# Patient Record
Sex: Male | Born: 1945 | Race: Black or African American | Hispanic: No | Marital: Married | Smoking: Current every day smoker
Health system: Southern US, Community
[De-identification: ages and names within clinical notes are randomized; demographics above are authoritative.]

## PROBLEM LIST (undated history)

## (undated) DIAGNOSIS — R569 Unspecified convulsions: Secondary | ICD-10-CM

## (undated) DIAGNOSIS — J449 Chronic obstructive pulmonary disease, unspecified: Secondary | ICD-10-CM

## (undated) DIAGNOSIS — I10 Essential (primary) hypertension: Secondary | ICD-10-CM

## (undated) HISTORY — PX: OTHER SURGICAL HISTORY: SHX169

---

## 2004-07-06 ENCOUNTER — Emergency Department: Payer: Self-pay | Admitting: Internal Medicine

## 2005-06-22 ENCOUNTER — Emergency Department: Payer: Self-pay | Admitting: Internal Medicine

## 2005-06-22 ENCOUNTER — Other Ambulatory Visit: Payer: Self-pay

## 2006-07-30 ENCOUNTER — Emergency Department: Payer: Self-pay | Admitting: Emergency Medicine

## 2006-11-01 ENCOUNTER — Emergency Department: Payer: Self-pay | Admitting: Emergency Medicine

## 2007-08-16 ENCOUNTER — Observation Stay: Payer: Self-pay | Admitting: Internal Medicine

## 2007-08-16 ENCOUNTER — Other Ambulatory Visit: Payer: Self-pay

## 2007-08-27 ENCOUNTER — Other Ambulatory Visit: Payer: Self-pay

## 2007-08-27 ENCOUNTER — Emergency Department: Payer: Self-pay | Admitting: Emergency Medicine

## 2007-10-08 ENCOUNTER — Emergency Department: Payer: Self-pay | Admitting: Emergency Medicine

## 2007-10-08 ENCOUNTER — Other Ambulatory Visit: Payer: Self-pay

## 2007-12-13 ENCOUNTER — Other Ambulatory Visit: Payer: Self-pay

## 2007-12-13 ENCOUNTER — Emergency Department: Payer: Self-pay | Admitting: Emergency Medicine

## 2008-05-07 ENCOUNTER — Ambulatory Visit: Payer: Self-pay

## 2008-06-09 ENCOUNTER — Emergency Department: Payer: Self-pay | Admitting: Internal Medicine

## 2008-07-07 ENCOUNTER — Inpatient Hospital Stay: Payer: Self-pay | Admitting: Specialist

## 2008-08-22 ENCOUNTER — Emergency Department: Payer: Self-pay | Admitting: Emergency Medicine

## 2008-11-10 ENCOUNTER — Inpatient Hospital Stay: Payer: Self-pay | Admitting: Internal Medicine

## 2009-06-10 ENCOUNTER — Emergency Department: Payer: Self-pay | Admitting: Emergency Medicine

## 2009-07-15 ENCOUNTER — Emergency Department: Payer: Self-pay | Admitting: Emergency Medicine

## 2009-08-11 ENCOUNTER — Emergency Department: Payer: Self-pay | Admitting: Unknown Physician Specialty

## 2009-08-31 ENCOUNTER — Emergency Department: Payer: Self-pay | Admitting: Emergency Medicine

## 2009-10-09 ENCOUNTER — Emergency Department: Payer: Self-pay | Admitting: Emergency Medicine

## 2009-10-10 ENCOUNTER — Inpatient Hospital Stay: Payer: Self-pay | Admitting: Internal Medicine

## 2009-12-14 ENCOUNTER — Inpatient Hospital Stay: Payer: Self-pay | Admitting: *Deleted

## 2010-02-10 ENCOUNTER — Ambulatory Visit: Payer: Self-pay | Admitting: Gastroenterology

## 2010-02-12 LAB — PATHOLOGY REPORT

## 2010-02-21 ENCOUNTER — Emergency Department: Payer: Self-pay | Admitting: Emergency Medicine

## 2010-05-03 ENCOUNTER — Inpatient Hospital Stay: Payer: Self-pay | Admitting: Internal Medicine

## 2010-11-15 ENCOUNTER — Inpatient Hospital Stay: Payer: Self-pay | Admitting: *Deleted

## 2011-01-05 ENCOUNTER — Ambulatory Visit: Payer: Self-pay | Admitting: Urology

## 2011-01-19 ENCOUNTER — Observation Stay: Payer: Self-pay | Admitting: Urology

## 2011-01-20 LAB — PATHOLOGY REPORT

## 2011-10-17 ENCOUNTER — Ambulatory Visit: Payer: Self-pay | Admitting: Internal Medicine

## 2012-04-30 ENCOUNTER — Ambulatory Visit: Payer: Self-pay | Admitting: Internal Medicine

## 2012-05-14 ENCOUNTER — Ambulatory Visit: Payer: Self-pay | Admitting: Internal Medicine

## 2012-10-19 ENCOUNTER — Ambulatory Visit: Payer: Self-pay | Admitting: Internal Medicine

## 2012-12-09 ENCOUNTER — Observation Stay: Payer: Self-pay | Admitting: Specialist

## 2012-12-09 LAB — CBC
HGB: 13 g/dL (ref 13.0–18.0)
MCHC: 34.3 g/dL (ref 32.0–36.0)
MCV: 87 fL (ref 80–100)
Platelet: 133 10*3/uL — ABNORMAL LOW (ref 150–440)
RBC: 4.34 10*6/uL — ABNORMAL LOW (ref 4.40–5.90)

## 2012-12-09 LAB — BASIC METABOLIC PANEL
Anion Gap: 8 (ref 7–16)
Calcium, Total: 8.8 mg/dL (ref 8.5–10.1)
Chloride: 91 mmol/L — ABNORMAL LOW (ref 98–107)
Creatinine: 1.01 mg/dL (ref 0.60–1.30)
EGFR (African American): >60
Glucose: 294 mg/dL — ABNORMAL HIGH (ref 65–99)
Osmolality: 267 (ref 275–301)
Potassium: 2.9 mmol/L — ABNORMAL LOW (ref 3.5–5.1)

## 2012-12-09 LAB — TROPONIN I
Troponin-I: <0.02 ng/mL
Troponin-I: <0.02 ng/mL

## 2012-12-09 LAB — CK TOTAL AND CKMB (NOT AT ARMC): CK, Total: 109 U/L (ref 35–232)

## 2012-12-09 LAB — HEMOGLOBIN A1C: Hemoglobin A1C: 3.7 % — ABNORMAL LOW (ref 4.2–6.3)

## 2012-12-10 DIAGNOSIS — R079 Chest pain, unspecified: Secondary | ICD-10-CM

## 2012-12-10 LAB — TROPONIN I: Troponin-I: <0.02 ng/mL

## 2012-12-10 LAB — BASIC METABOLIC PANEL
Anion Gap: 9 (ref 7–16)
BUN: 15 mg/dL (ref 7–18)
Creatinine: 0.91 mg/dL (ref 0.60–1.30)
EGFR (African American): >60
EGFR (Non-African Amer.): >60
Osmolality: 266 (ref 275–301)
Sodium: 132 mmol/L — ABNORMAL LOW (ref 136–145)

## 2012-12-10 LAB — SODIUM, URINE, RANDOM: Sodium, Urine Random: 152 mmol/L (ref 20–110)

## 2012-12-10 LAB — CK TOTAL AND CKMB (NOT AT ARMC): CK-MB: <0.5 ng/mL — ABNORMAL LOW (ref 0.5–3.6)

## 2012-12-10 LAB — CREATININE, URINE, RANDOM: Creatinine, Urine Random: 141.8 mg/dL — ABNORMAL HIGH (ref 30.0–125.0)

## 2013-04-08 ENCOUNTER — Ambulatory Visit: Payer: Self-pay | Admitting: Internal Medicine

## 2014-07-18 NOTE — H&P (Signed)
PATIENT NAME:  Larry Castaneda, Larry Castaneda MR#:  [id number] DATE OF BIRTH:  [birth year]  DATE OF ADMISSION:  12/09/2012  PRIMARY CARE PHYSICIAN:  [facility]   CHIEF COMPLAINT:  Chest pain.   HISTORY OF PRESENT ILLNESS: A 69 year old male patient with history of hypertension, likely undiagnosed diabetes mellitus, chronic pancreatitis and alcohol abuse, presents to the Emergency Room complaining of 2 weeks of chest pain. The patient was working in his yard 2 weeks back, noticed some central chest pain which was nonradiating with diaphoresis; no nausea or vomiting, shortness of breath, palpitations, or syncope. This lasted about half a day, resolved on its own without any medications. This reoccurred 2 days back with the patient again working in the yard. Lasted a similar time and resolved. The patient decided to present to the Emergency Room. Here his EKG has shown normal sinus rhythm with normal cardiac enzymes but, considering his risk factors of tobacco abuse, hypertension, diabetes, the patient is being admitted to the hospitalist service for a stress test and observation on tele and get cardiac enzymes. The patient does not have any diagnosis of GERD.    PAST MEDICAL HISTORY 1.  Hypertension.  2.  Chronic pancreatitis.  3.  Alcohol abuse.  4.  Tobacco abuse.  5.  Seizure disorder.   ALLERGIES: ACCUPRIL AND METOPROLOL.   SOCIAL HISTORY: The patient smokes a pack a day. He drinks a 6-pack of beer every day. No illicit drugs. He is retired.   CODE STATUS: Full code.   FAMILY HISTORY: No premature coronary artery disease in the family. Father had Alzheimer's disease.   HOME MEDICATIONS: Include 1.  Dilantin 100 mg daily.  2.  Norco 5/325, 1 tablet every 6 hours as needed.  3.  Norvasc 5 mg daily.   REVIEW OF SYSTEMS CONSTITUTIONAL: No fatigue, weakness, weight loss, weight gain.  EYES: No blurred vision, pain, redness.  ENT: No tinnitus, ear pain, hearing loss.  RESPIRATORY:  No  cough,weezing, hemoptysis.  CARDIOVASCULAR: No chest pain, orthopnea, edema.  GASTROINTESTINAL: No nausea, vomiting, diarrhea, abdominal pain.  GENITOURINARY: No dysuria, hematuria, frequency.  ENDOCRINE: No polyuria, nocturia, thyroid problems.  HEMATOLOGIC AND LYMPHATIC: No anemia, easy bruising, bleeding.  INTEGUMENTARY: No acne, rash, lesions.  MUSCULOSKELETAL: No back pain, arthritis.  NEUROLOGIC: No focal numbness, weakness, dysarthria.  PSYCHIATRIC: No anxiety or depression.   PHYSICAL EXAMINATION VITAL SIGNS: Temperature 98.3, pulse of 85, respirations 18, blood pressure 174/95, saturating 98% on room air.  GENERAL: Moderately-built African American male patient sitting at the side of the bed, comfortable, conversational, cooperative with exam.  PSYCHIATRIC:  Alert and oriented x 3. Mood and affect appropriate. Judgment intact.  HEENT: Atraumatic, normocephalic. Oral mucosa moist and pink. External ears and nose normal. No pallor. Pupils bilaterally equal and reactive to light. NECK: Supple. No thyromegaly. No palpable lymph nodes. Trachea midline. No carotid bruit or JVD.  CARDIOVASCULAR: S1, S2, without any murmurs. Peripheral pulses 2+. No edema, no chest tenderness.  RESPIRATORY: Normal work of breathing. Clear to auscultation on both sides.  GASTROINTESTINAL: Soft abdomen, nontender. Bowel sounds present. No hepatosplenomegaly palpable.  SKIN: Warm and dry. No petechiae, rash, ulcers.  MUSCULOSKELETAL: No joint swelling, redness, effusion of the large joints. Normal muscle tone.  NEUROLOGICAL: Motor strength 5 out of 5 in upper and lower extremities. Sensation to fine touch intact all over.  GENITOURINARY: No CVA tenderness or bladder distention.   LABORATORY AND RADIOLOGIC DATA:  Glucose 294, BUN 9, creatinine 1, sodium 128, potassium 2.9,  chloride 91. Troponin less than 0.02. WBC 9.9, hemoglobin 13, platelets of 133. Chest x-ray shows no acute disease. EKG shows normal sinus  rhythm, no acute changes.   ASSESSMENT AND PLAN 1.  Chest pain. Does not seem to have typical symptoms, did occur with exertion but does not happen every time. The patient does have chronic pancreatitis with smoking, alcohol; could be gastroesophageal reflux disease but considering the significant risk factors of hypertension, undiagnosed diabetes, tobacco abuse and uncontrolled hypertension we will admit the patient onto a telemetry floor. Check 2 more sets of cardiac enzymes and get a stress test tomorrow morning. If the stress test is negative, the patient should be discharged on proton pump inhibitors for possible gastroesophageal reflux disease.  We will also add aspirin.  2.  New-onset diabetes mellitus. The patient does have blood sugars of 296 with polyuria. Will check a HbA1c, start him on metformin.  3.  Uncontrolled diabetes mellitus. We will continue his home medications. Use IV p.r.n. medications. If sustained high, will need further addition of medications.  4.  Tobacco abuse. I have counseled the patient to quit smoking for greater than 3 minutes, explaining its effects on increasing risk factors along with hypertension and diabetes.  5.  Alcohol abuse. The patient is at high risk for alcohol withdraws. We will put him on a clinical institute withdrawal assessment of alcohol  protocol.  6.  Hyponatremia, a sodium of 128. He has had mild hyponatremia in the past. This seems worse, likely from the alcohol. We will check urine sodium, creatinine. We will repeat a BMP in the morning.  7.  Hypokalemia, likely secondary from alcohol. Replace. The patient is also on hydrochlorothiazide.  Will add potassium supplements daily.  8.  Mild thrombocytopenia, platelets of 133.  9.  Deep vein thrombosis prophylaxis with Lovenox.  10.  CODE STATUS: Full code.   TIME SPENT TODAY ON THIS CASE: Was 40 minutes.    ____________________________ Molinda BailiffSrikar R. Rudie Sermons, MD srs:cs D: 12/09/2012 14:09:47  ET T: 12/09/2012 14:39:49 ET JOB#: [id number]  cc: Wardell HeathSrikar R. Kepler Mccabe, MD, <Dictator> [name] Orie FishermanSRIKAR R Beyonce Sawatzky MD ELECTRONICALLY SIGNED 12/09/2012 17:18

## 2014-07-18 NOTE — Discharge Summary (Signed)
PATIENT NAME:  Larry Castaneda, Larry Castaneda MR#:  [id number] DATE OF BIRTH:  02/07/1946  For a detailed note, please see the history and physical done on admission by Dr. Elpidio AnisSudini.   DIAGNOSES AT DISCHARGE: As follows: Acute chest pain, likely suspected to be pericarditis, chronic obstructive pulmonary disease with ongoing tobacco abuse. Hypertension. Alcohol abuse. History of seizures.   DIET: The patient is being discharged on a low-sodium diet.   ACTIVITY: As tolerated.   FOLLOW-UP: [name] in the next 1 to 2 weeks.   DISCHARGE MEDICATIONS:  Hydrochlorothiazide 25 mg daily, albuterol inhaler 4 times daily as needed, amlodipine 5 mg daily  and ibuprofen 400 mg t.i.d. x3 days.   PERTINENT STUDIES DONE DURING THE HOSPITAL COURSE: Are as follows: A chest x-ray done on admission showing no acute cardiopulmonary disease. A nuclear medicine myocardial scan done showing no significant wall-motion abnormality. Overall a low-risk scan. EF of 60%. No EKG changes concerning for ischemia. Minor diffuse ST elevation and PR depression on EKG suggestive of pericarditis.   HOSPITAL COURSE: A [age] year old male with medical problems as mentioned above who  presented to the hospital with chest pain.   1.  Chest pain. The exact source of the patient's chest pain is unclear but suspected to be secondary to pericarditis. The patient's EKG during his stress test showed diffuse slight ST elevation with PR depression consistent with pericarditis although the patient denied any pleuritic type of his chest pain. His admission EKG did not show those changes, though. The patient was observed on telemetry, had 3 sets of cardiac markers checked which were negative, underwent a nuclear medicine myocardial scan which did not show any evidence of acute myocardial ischemia. The patient was therefore discharged home on some ibuprofen for the next 3 days to treat his underlying pericarditis.  2.  History of alcohol abuse. The  patient showed no evidence of alcohol withdrawal. He was maintained on CIWA protocol in the hospital.  3.  Hyponatremia and hypokalemia. This was likely secondary to poor p.o. intake and use of hydrochlorothiazide. These were supplemented with IV fluids and oral potassium supplementation, and they have now improved and resolved.  4.  History of seizures. These were likely alcohol withdrawal seizures, The patient has not had any seizures in the past 3 years, he is currently not taking any antiepileptics. He was therefore not discharged on any antiseizure medications at this time.  5.  Hypertension. The patient remained hemodynamically stable on his hydrochlorothiazide. He was also discharged on Norvasc, as he needed some additional meds for his blood pressure control.   CODE STATUS: FULL CODE.   TIME SPENT ON DISCHARGE: 40 minutes.    ____________________________ Rolly PancakeVivek J. Cherlynn KaiserSainani, MD vjs:np D: 12/10/2012 16:18:24 ET T: 12/10/2012 19:50:32 ET JOB#: [id number]  cc: Rolly PancakeVivek J. Cherlynn KaiserSainani, MD, <Dictator> [name] [place] SirenVIVEK J Secilia Apps MD ELECTRONICALLY SIGNED 12/13/2012 17:24

## 2014-10-16 ENCOUNTER — Other Ambulatory Visit: Payer: Self-pay | Admitting: Internal Medicine

## 2014-10-16 DIAGNOSIS — J439 Emphysema, unspecified: Secondary | ICD-10-CM

## 2014-10-27 ENCOUNTER — Ambulatory Visit: Admission: RE | Admit: 2014-10-27 | Payer: Medicare Other | Source: Ambulatory Visit

## 2014-10-30 ENCOUNTER — Inpatient Hospital Stay
Admission: EM | Admit: 2014-10-30 | Discharge: 2014-10-31 | DRG: 101 | Disposition: A | Discharge status: Home / Self Care | Payer: Medicare Other | Attending: Internal Medicine | Admitting: Internal Medicine

## 2014-10-30 ENCOUNTER — Emergency Department: Payer: Medicare Other

## 2014-10-30 ENCOUNTER — Encounter: Payer: Self-pay | Admitting: Emergency Medicine

## 2014-10-30 ENCOUNTER — Ambulatory Visit
Admission: RE | Admit: 2014-10-30 | Discharge: 2014-10-30 | Disposition: A | Discharge status: Home / Self Care | Payer: Medicare Other | Source: Ambulatory Visit | Attending: Internal Medicine | Admitting: Internal Medicine

## 2014-10-30 DIAGNOSIS — F10239 Alcohol dependence with withdrawal, unspecified: Secondary | ICD-10-CM | POA: Diagnosis present

## 2014-10-30 DIAGNOSIS — G40509 Epileptic seizures related to external causes, not intractable, without status epilepticus: Secondary | ICD-10-CM | POA: Diagnosis not present

## 2014-10-30 DIAGNOSIS — D696 Thrombocytopenia, unspecified: Secondary | ICD-10-CM | POA: Diagnosis present

## 2014-10-30 DIAGNOSIS — E871 Hypo-osmolality and hyponatremia: Secondary | ICD-10-CM | POA: Diagnosis present

## 2014-10-30 DIAGNOSIS — R569 Unspecified convulsions: Secondary | ICD-10-CM | POA: Diagnosis not present

## 2014-10-30 DIAGNOSIS — E785 Hyperlipidemia, unspecified: Secondary | ICD-10-CM | POA: Diagnosis present

## 2014-10-30 DIAGNOSIS — I10 Essential (primary) hypertension: Secondary | ICD-10-CM | POA: Diagnosis present

## 2014-10-30 DIAGNOSIS — E860 Dehydration: Secondary | ICD-10-CM | POA: Diagnosis present

## 2014-10-30 DIAGNOSIS — K219 Gastro-esophageal reflux disease without esophagitis: Secondary | ICD-10-CM | POA: Diagnosis present

## 2014-10-30 DIAGNOSIS — J449 Chronic obstructive pulmonary disease, unspecified: Secondary | ICD-10-CM | POA: Diagnosis present

## 2014-10-30 DIAGNOSIS — E876 Hypokalemia: Secondary | ICD-10-CM | POA: Diagnosis present

## 2014-10-30 DIAGNOSIS — Z820 Family history of epilepsy and other diseases of the nervous system: Secondary | ICD-10-CM | POA: Diagnosis not present

## 2014-10-30 DIAGNOSIS — J439 Emphysema, unspecified: Secondary | ICD-10-CM

## 2014-10-30 DIAGNOSIS — N400 Enlarged prostate without lower urinary tract symptoms: Secondary | ICD-10-CM | POA: Diagnosis present

## 2014-10-30 DIAGNOSIS — F10931 Alcohol use, unspecified with withdrawal delirium: Secondary | ICD-10-CM

## 2014-10-30 DIAGNOSIS — F17210 Nicotine dependence, cigarettes, uncomplicated: Secondary | ICD-10-CM | POA: Diagnosis present

## 2014-10-30 DIAGNOSIS — F10230 Alcohol dependence with withdrawal, uncomplicated: Secondary | ICD-10-CM | POA: Diagnosis not present

## 2014-10-30 DIAGNOSIS — F10231 Alcohol dependence with withdrawal delirium: Secondary | ICD-10-CM

## 2014-10-30 DIAGNOSIS — Z880 Allergy status to penicillin: Secondary | ICD-10-CM | POA: Diagnosis not present

## 2014-10-30 HISTORY — DX: Unspecified convulsions: R56.9

## 2014-10-30 HISTORY — DX: Chronic obstructive pulmonary disease, unspecified: J44.9

## 2014-10-30 HISTORY — DX: Essential (primary) hypertension: I10

## 2014-10-30 LAB — CBC WITH DIFFERENTIAL/PLATELET
Basophils Absolute: 0 10*3/uL (ref 0–0.1)
Eosinophils Absolute: 0 10*3/uL (ref 0–0.7)
HEMATOCRIT: 35.3 % — AB (ref 40.0–52.0)
Hemoglobin: 11.8 g/dL — ABNORMAL LOW (ref 13.0–18.0)
Lymphocytes Relative: 16 %
Lymphs Abs: 0.9 10*3/uL — ABNORMAL LOW (ref 1.0–3.6)
MCH: 30 pg (ref 26.0–34.0)
MCHC: 33.3 g/dL (ref 32.0–36.0)
MCV: 90.1 fL (ref 80.0–100.0)
MONO ABS: 0.9 10*3/uL (ref 0.2–1.0)
Monocytes Relative: 15 %
Neutro Abs: 4 10*3/uL (ref 1.4–6.5)
Neutrophils Relative %: 68 %
PLATELETS: 78 10*3/uL — AB (ref 150–440)
RBC: 3.92 MIL/uL — AB (ref 4.40–5.90)
RDW: 21.9 % — AB (ref 11.5–14.5)
WBC: 5.8 10*3/uL (ref 3.8–10.6)

## 2014-10-30 LAB — BASIC METABOLIC PANEL
Anion gap: 17 — ABNORMAL HIGH (ref 5–15)
BUN: 6 mg/dL (ref 6–20)
CHLORIDE: 91 mmol/L — AB (ref 101–111)
CO2: 21 mmol/L — ABNORMAL LOW (ref 22–32)
Calcium: 9.4 mg/dL (ref 8.9–10.3)
Creatinine, Ser: 0.76 mg/dL (ref 0.61–1.24)
GFR calc Af Amer: >60 mL/min (ref >60)
GFR calc non Af Amer: >60 mL/min (ref >60)
GLUCOSE: 135 mg/dL — AB (ref 65–99)
Potassium: 2.3 mmol/L — CL (ref 3.5–5.1)
Sodium: 129 mmol/L — ABNORMAL LOW (ref 135–145)

## 2014-10-30 LAB — URINE DRUG SCREEN, QUALITATIVE (ARMC ONLY)
AMPHETAMINES, UR SCREEN: NOT DETECTED
BENZODIAZEPINE, UR SCRN: NOT DETECTED
Barbiturates, Ur Screen: NOT DETECTED
Cannabinoid 50 Ng, Ur ~~LOC~~: NOT DETECTED
Cocaine Metabolite,Ur ~~LOC~~: NOT DETECTED
MDMA (Ecstasy)Ur Screen: NOT DETECTED
Methadone Scn, Ur: NOT DETECTED
Opiate, Ur Screen: NOT DETECTED
Phencyclidine (PCP) Ur S: NOT DETECTED
Tricyclic, Ur Screen: NOT DETECTED

## 2014-10-30 LAB — MAGNESIUM: MAGNESIUM: 1 mg/dL — AB (ref 1.7–2.4)

## 2014-10-30 LAB — ETHANOL: Alcohol, Ethyl (B): <5 mg/dL (ref <5)

## 2014-10-30 MED ORDER — ACETAMINOPHEN 650 MG RE SUPP: 650.0000 mg | Freq: Four times a day (QID) | RECTAL | Status: DC | PRN

## 2014-10-30 MED ORDER — ATORVASTATIN CALCIUM 20 MG PO TABS
20.0000 mg | ORAL_TABLET | Freq: Every evening | ORAL | Status: DC
  Administered 2014-10-30: 20 mg via ORAL
  Filled 2014-10-30: qty 1

## 2014-10-30 MED ORDER — PANTOPRAZOLE SODIUM 40 MG PO TBEC
40.0000 mg | DELAYED_RELEASE_TABLET | Freq: Every day | ORAL | Status: DC
  Administered 2014-10-30: 40 mg via ORAL
  Filled 2014-10-30 (×2): qty 1

## 2014-10-30 MED ORDER — LORAZEPAM 1 MG PO TABS: 1.0000 mg | ORAL_TABLET | Freq: Four times a day (QID) | ORAL | Status: DC | PRN

## 2014-10-30 MED ORDER — SODIUM CHLORIDE 0.9 % IV SOLN
1000.0000 mg | Freq: Once | INTRAVENOUS | Status: AC
  Administered 2014-10-30: 1000 mg via INTRAVENOUS
  Filled 2014-10-30 (×2): qty 10

## 2014-10-30 MED ORDER — SODIUM CHLORIDE 0.9 % IV BOLUS (SEPSIS)
1000.0000 mL | Freq: Once | INTRAVENOUS | Status: AC
  Administered 2014-10-30: 1000 mL via INTRAVENOUS

## 2014-10-30 MED ORDER — THIAMINE HCL 100 MG/ML IJ SOLN: 100.0000 mg | Freq: Every day | INTRAMUSCULAR | Status: DC

## 2014-10-30 MED ORDER — POTASSIUM CHLORIDE CRYS ER 20 MEQ PO TBCR
40.0000 meq | EXTENDED_RELEASE_TABLET | Freq: Once | ORAL | Status: AC
  Administered 2014-10-30: 40 meq via ORAL
  Filled 2014-10-30: qty 2

## 2014-10-30 MED ORDER — POTASSIUM CHLORIDE IN NACL 20-0.9 MEQ/L-% IV SOLN
INTRAVENOUS | Status: DC
  Administered 2014-10-30 – 2014-10-31 (×2): via INTRAVENOUS
  Filled 2014-10-30 (×6): qty 1000

## 2014-10-30 MED ORDER — SODIUM CHLORIDE 0.9 % IV SOLN
500.0000 mg | Freq: Two times a day (BID) | INTRAVENOUS | Status: DC
  Administered 2014-10-30 – 2014-10-31 (×2): 500 mg via INTRAVENOUS
  Filled 2014-10-30 (×4): qty 5

## 2014-10-30 MED ORDER — IOHEXOL 300 MG/ML  SOLN
75.0000 mL | Freq: Once | INTRAMUSCULAR | Status: AC | PRN
  Administered 2014-10-30: 75 mL via INTRAVENOUS

## 2014-10-30 MED ORDER — NICOTINE 14 MG/24HR TD PT24
14.0000 mg | MEDICATED_PATCH | Freq: Every day | TRANSDERMAL | Status: DC
  Administered 2014-10-30: 14 mg via TRANSDERMAL
  Filled 2014-10-30 (×2): qty 1

## 2014-10-30 MED ORDER — FOLIC ACID 1 MG PO TABS
1.0000 mg | ORAL_TABLET | Freq: Every day | ORAL | Status: DC
  Administered 2014-10-30 – 2014-10-31 (×2): 1 mg via ORAL
  Filled 2014-10-30 (×2): qty 1

## 2014-10-30 MED ORDER — LORAZEPAM 2 MG/ML IJ SOLN: 1.0000 mg | Freq: Four times a day (QID) | INTRAMUSCULAR | Status: DC | PRN

## 2014-10-30 MED ORDER — ACETAMINOPHEN 325 MG PO TABS: 650.0000 mg | ORAL_TABLET | Freq: Four times a day (QID) | ORAL | Status: DC | PRN

## 2014-10-30 MED ORDER — DOXAZOSIN MESYLATE 2 MG PO TABS
2.0000 mg | ORAL_TABLET | Freq: Every day | ORAL | Status: DC
  Administered 2014-10-30: 2 mg via ORAL
  Filled 2014-10-30: qty 1

## 2014-10-30 MED ORDER — LORAZEPAM 2 MG/ML IJ SOLN
1.0000 mg | Freq: Once | INTRAMUSCULAR | Status: AC
  Administered 2014-10-30: 1 mg via INTRAVENOUS
  Filled 2014-10-30: qty 1

## 2014-10-30 MED ORDER — ONDANSETRON HCL 4 MG PO TABS: 4.0000 mg | ORAL_TABLET | Freq: Four times a day (QID) | ORAL | Status: DC | PRN

## 2014-10-30 MED ORDER — ONDANSETRON HCL 4 MG/2ML IJ SOLN: 4.0000 mg | Freq: Four times a day (QID) | INTRAMUSCULAR | Status: DC | PRN

## 2014-10-30 MED ORDER — POTASSIUM CHLORIDE 10 MEQ/100ML IV SOLN
10.0000 meq | INTRAVENOUS | Status: AC
  Administered 2014-10-30 (×2): 10 meq via INTRAVENOUS
  Filled 2014-10-30 (×2): qty 100

## 2014-10-30 MED ORDER — POTASSIUM CHLORIDE CRYS ER 20 MEQ PO TBCR
20.0000 meq | EXTENDED_RELEASE_TABLET | Freq: Two times a day (BID) | ORAL | Status: DC
  Administered 2014-10-30 – 2014-10-31 (×2): 20 meq via ORAL
  Filled 2014-10-30 (×2): qty 1

## 2014-10-30 MED ORDER — ADULT MULTIVITAMIN W/MINERALS CH
1.0000 | ORAL_TABLET | Freq: Every day | ORAL | Status: DC
Start: 2014-10-30 — End: 2014-10-31
  Administered 2014-10-30: 1 via ORAL
  Filled 2014-10-30: qty 1

## 2014-10-30 MED ORDER — SODIUM CHLORIDE 0.9 % IJ SOLN
3.0000 mL | Freq: Two times a day (BID) | INTRAMUSCULAR | Status: DC
  Administered 2014-10-30: 3 mL via INTRAVENOUS

## 2014-10-30 MED ORDER — VITAMIN B-1 100 MG PO TABS
100.0000 mg | ORAL_TABLET | Freq: Every day | ORAL | Status: DC
  Administered 2014-10-30 – 2014-10-31 (×2): 100 mg via ORAL
  Filled 2014-10-30 (×2): qty 1

## 2014-10-30 NOTE — ED Notes (Signed)
Pharmacy called to sent Keppra to the floor now instead.

## 2014-10-30 NOTE — ED Provider Notes (Signed)
[facility] Emergency Department Provider Note   ____________________________________________  Time seen: 3:35 PM I have reviewed the triage vital signs and the triage nursing note.  HISTORY  Chief Complaint Seizures   Historian Patient, limited historian. EMS provides additional history.  HPI Larry Castaneda is a 69 y.o. male who is brought in by EMS after having 2 seizures. EMS did not notice post ictal state. Although he has a poor historian. He was seen at [facility] clinic today for a chest CT. Patient had no reported traumatic history. Patient states he had a seizure about 2 years ago, however he does not know if he is on seizure medications, and he denies alcohol withdrawal in the past. He is giving a history of drinking alcohol but can't tell if it is daily or chronic.  No recent illnesses. Being worked up with pulmonology for emphysema. He had a chest CT today.  After while patient was able to provide additional history that he does drink daily, he had been drinking "gin "undetermined amount up until about 3 days ago when he stopped drinking gin and started drinking just 2 beers daily. First he told me he might of had a head injury or fall striking head, and then he says he thinks he did not.   Past Medical History  Diagnosis Date  . Hypertension   . Seizures   . COPD (chronic obstructive pulmonary disease)     There are no active problems to display for this patient.   History reviewed. No pertinent past surgical history.  Current Outpatient Rx  Name  Route  Sig  Dispense  Refill  . atorvastatin (LIPITOR) 20 MG tablet   Oral   Take 20 mg by mouth every evening.         Marland Kitchen doxazosin (CARDURA) 2 MG tablet   Oral   Take 2 mg by mouth at bedtime.         . hydrochlorothiazide (HYDRODIURIL) 25 MG tablet   Oral   Take 25 mg by mouth daily.         Marland Kitchen omeprazole (PRILOSEC) 40 MG capsule   Oral   Take 40 mg by mouth daily.            Allergies Penicillins  Family History  Problem Relation Age of Onset  . Family history unknown: Yes    Social History History  Substance Use Topics  . Smoking status: Current Every Day Smoker -- 1.00 packs/day    Types: Cigarettes  . Smokeless tobacco: Never Used  . Alcohol Use: 1.2 oz/week    2 Cans of beer per week     Comment: was drinking Gin up until 3 days ago drank 1 beer 8/3   positive for alcohol use, patient unable to quantify. Positive smoker.  Review of Systems Limited as patient is a poor historian/possibly post ictal Constitutional: . Eyes: Negative for visual changes. ENT: Negative for sore throat. Cardiovascular: Negative for chest pain. Respiratory: Negative for shortness of breath. Gastrointestinal: Negative for abdominal pain, vomiting and diarrhea. Genitourinary: Negative for dysuria. Musculoskeletal: Negative for back pain. Skin: Negative for rash. Neurological: Negative for headaches, focal weakness or numbness. 10 point Review of Systems otherwise negative ____________________________________________   PHYSICAL EXAM:  VITAL SIGNS: ED Triage Vitals  Enc Vitals Group     BP --      Pulse --      Resp --      Temp --      Temp  src --      SpO2 10/30/14 1538 97 %     Weight --      Height --      Head Cir --      Peak Flow --      Pain Score --      Pain Loc --      Pain Edu? --      Excl. in GC? --      Constitutional: Alert and confused. In no acute distress. Eyes: Right eye conjunctiva. PERRL. Normal extraocular movements. ENT   Head: Normocephalic and atraumatic.   Nose: No congestion/rhinnorhea.   Mouth/Throat: Mucous membranes are moist.   Neck: No stridor. Cardiovascular/Chest: Normal rate, regular rhythm.  No murmurs, rubs, or gallops. Respiratory: Normal respiratory effort without tachypnea nor retractions. Breath sounds are clear and equal bilaterally. No wheezes/rales/rhonchi. Gastrointestinal: Soft. No  distention, no guarding, no rebound. Nontender   Genitourinary/rectal:Deferred Musculoskeletal: Nontender with normal range of motion in all extremities. No joint effusions.  No lower extremity tenderness nor edema. Neurologic:  No slurred speech. Poor historian, trouble memory. No gross or focal neurologic deficits are appreciated. Skin:  Skin is warm, dry and intact. No rash noted.   ____________________________________________   EKG I, Governor Rooks, MD, the attending physician have personally viewed and interpreted all ECGs.  98 beats appear normal sinus rhythm. Narrow QRS. Normal axis. Normal ST and T-wave. ____________________________________________  LABS (pertinent positives/negatives)  Sodium 129 Potassium 2.3 Chloride 91 CO2 21 Ethanol less than 5 White blood count 5.8, hemoglobin 1.8, platelet count 78  ____________________________________________  RADIOLOGY All Xrays were viewed by me. Imaging interpreted by Radiologist.  CT head noncontrast:  IMPRESSION: 1. No acute intracranial abnormality or significant interval change. 2. Stable atrophy and white matter disease.  __________________________________________  PROCEDURES  Procedure(s) performed: None Critical Care performed: None  ____________________________________________   ED COURSE / ASSESSMENT AND PLAN  CONSULTATIONS: Hospitalist for admission  Pertinent labs & imaging results that were available during my care of the patient were reviewed by me and considered in my medical decision making (see chart for details).  Patient is poor historian versus postictal, who after additional history seems like source of seizure is most likely alcohol withdrawal. Given the confusing history, as well as his risk factors of being alcoholic, I am going to check a head CT to rule out intracranial abnormality as a source of seizure. Patient was given 1 mg Ativan IV due to the clinical history.  When daughter  arrived, she gave a history that the patient was previously on Dilantin and she was not aware that he had been taken off of it. The patient seemed a little drowsy and still out of it, however he did type up at that point in time and stated that he was told he was allergic to it. I am going to load him with a dose of Keppra.  Multiple electrolyte abnormalities including low sodium. Last labs in our system from 2014 which also showed low sodium. I suspect this is chronic. His potassium was significantly low, and he is given by mouth and IV replacement.  Heart rate at rest was 110 when I walked in the room and he was resting, it did trend back down into the 90s. His blood pressures systolic in the 130s to 140s. I'm still suspicious of alcohol withdrawal/delirium tremens. The patient does not want to do alcohol rehabilitation, however he is willing to stay in the hospital for observation monitoring and  treatment due to alcohol withdrawal seizures.  Patient / Family / Caregiver informed of clinical course, medical decision-making process, and agree with plan.   ___________________________________________   FINAL CLINICAL IMPRESSION(S) / ED DIAGNOSES   Final diagnoses:  Seizure  Delirium tremens  Hypokalemia       Governor Rooks, MD 10/30/14 1825

## 2014-10-30 NOTE — ED Notes (Signed)
Dr. Shaune Pollack made aware of potassium level.

## 2014-10-30 NOTE — H&P (Signed)
[facility] - [place] at [facility]   PATIENT NAME: Larry Castaneda    MR#:  [id number]  DATE OF BIRTH:  1945/06/16  DATE OF ADMISSION:  10/30/2014  PRIMARY CARE PHYSICIAN: Hyman Hopes, MD   REQUESTING/REFERRING PHYSICIAN: Dr. Governor Rooks  CHIEF COMPLAINT:   Chief Complaint  Patient presents with  . Seizures   Recurrent seizures  HISTORY OF PRESENT ILLNESS:  Larry Castaneda  is a 69 y.o. male with a known history of hypertension, seizures, COPD with ongoing tobacco abuse, GERD who presented to the hospital after a witnessed seizure by his daughter. Patient apparently went for a pulmonary function tests and CT chest earlier today to workup his COPD and after he got home from the tests when he was getting out of his car he fell backwards and hit his head and then the daughter noticed that he was shaking vigorously. She also noticed that he had some whitish foam coming out of the side of his mouth. Patient was having a tonic-clonic seizure with loss of anywhere from 5-7 minutes as per his daughter. Patient cannot recall these events at all. He was somewhat lethargic after the event and then brought to the ER for further evaluation. Patient was noted to be hyponatremic and hypokalemic and likely thought to have an alcohol withdrawal seizure. Hospitalist services were contacted for further treatment and evaluation.  PAST MEDICAL HISTORY:   Past Medical History  Diagnosis Date  . Hypertension   . Seizures   . COPD (chronic obstructive pulmonary disease)     PAST SURGICAL HISTORY:  History reviewed. No pertinent past surgical history.  SOCIAL HISTORY:   History  Substance Use Topics  . Smoking status: Current Every Day Smoker -- 1.00 packs/day    Types: Cigarettes  . Smokeless tobacco: Never Used  . Alcohol Use: 1.2 oz/week    2 Cans of beer per week     Comment: was drinking Gin up until 3 days ago drank 1 beer 8/3    FAMILY HISTORY:    Family History  Problem Relation Age of Onset  . Alzheimer's disease Father     DRUG ALLERGIES:   Allergies  Allergen Reactions  . Penicillins Swelling    REVIEW OF SYSTEMS:   Review of Systems  Constitutional: Negative for fever and weight loss.  HENT: Negative for congestion, nosebleeds and tinnitus.   Eyes: Negative for blurred vision, double vision and redness.  Respiratory: Negative for cough, hemoptysis and shortness of breath.   Cardiovascular: Negative for chest pain, orthopnea, leg swelling and PND.  Gastrointestinal: Negative for nausea, vomiting, abdominal pain, diarrhea and melena.  Genitourinary: Negative for dysuria, urgency and hematuria.  Musculoskeletal: Negative for joint pain and falls.  Neurological: Positive for seizures (likely alcohol withdrawal). Negative for dizziness, tingling, sensory change, focal weakness, weakness and headaches.  Endo/Heme/Allergies: Negative for polydipsia. Does not bruise/bleed easily.  Psychiatric/Behavioral: Negative for depression and memory loss. The patient is not nervous/anxious.     MEDICATIONS AT HOME:   Prior to Admission medications   Medication Sig Start Date End Date Taking? Authorizing Provider  atorvastatin (LIPITOR) 20 MG tablet Take 20 mg by mouth every evening.   Yes Historical Provider, MD  doxazosin (CARDURA) 2 MG tablet Take 2 mg by mouth at bedtime.   Yes Historical Provider, MD  hydrochlorothiazide (HYDRODIURIL) 25 MG tablet Take 25 mg by mouth daily.   Yes Historical Provider, MD  omeprazole (PRILOSEC) 40 MG capsule Take 40 mg by mouth  daily.   Yes Historical Provider, MD      VITAL SIGNS:  Blood pressure 139/87, pulse 105, temperature 98.3 F (36.8 C), temperature source Oral, resp. rate 17, height  (1.854 m), weight 72.576 kg (160 lb), SpO2 94 %.  PHYSICAL EXAMINATION:  Physical Exam  GENERAL:  69 y.o.-year-old patient lying in the bed with no acute distress.  EYES: Pupils equal, round,  reactive to light and accommodation. No scleral icterus. Extraocular muscles intact.  HEENT: Head atraumatic, normocephalic. Oropharynx and nasopharynx clear. No oropharyngeal erythema, moist oral mucosa  NECK:  Supple, no jugular venous distention. No thyroid enlargement, no tenderness.  LUNGS: Normal breath sounds bilaterally, no wheezing, rales, rhonchi. No use of accessory muscles of respiration.  CARDIOVASCULAR: S1, S2 RRR. No murmurs, rubs, gallops, clicks.  ABDOMEN: Soft, nontender, nondistended. Bowel sounds present. No organomegaly or mass.  EXTREMITIES: No pedal edema, cyanosis, or clubbing. + 2 pedal & radial pulses b/l.   NEUROLOGIC: Cranial nerves II through XII are intact. No focal Motor or sensory deficits appreciated b/l PSYCHIATRIC: The patient is alert and oriented x 3.  SKIN: No obvious rash, lesion, or ulcer.   LABORATORY PANEL:   CBC  Recent Labs Lab 10/30/14 1551  WBC 5.8  HGB 11.8*  HCT 35.3*  PLT 78*   ------------------------------------------------------------------------------------------------------------------  Chemistries   Recent Labs Lab 10/30/14 1551  NA 129*  K 2.3*  CL 91*  CO2 21*  GLUCOSE 135*  BUN 6  CREATININE 0.76  CALCIUM 9.4   ------------------------------------------------------------------------------------------------------------------  Cardiac Enzymes No results for input(s): TROPONINI in the last 168 hours. ------------------------------------------------------------------------------------------------------------------  RADIOLOGY:  Ct Head Wo Contrast  10/30/2014   CLINICAL DATA:  Seizures.  EXAM: CT HEAD WITHOUT CONTRAST  TECHNIQUE: Contiguous axial images were obtained from the base of the skull through the vertex without intravenous contrast.  COMPARISON:  MRI brain 04/08/2013.  FINDINGS: Mild periventricular white matter hypoattenuation is similar to the prior studies. No acute infarct, hemorrhage, or mass lesion is  present. Mild generalized atrophy is within normal limits for age. The ventricles are of normal size. No significant extra-axial fluid collection is present.  A single left ethmoid air cell is opacified. The paranasal sinuses and mastoid air cells are otherwise clear.  IMPRESSION: 1. No acute intracranial abnormality or significant interval change. 2. Stable atrophy and white matter disease.   Electronically Signed   By: Marin Roberts M.D.   On: 10/30/2014 16:45   Ct Chest W Contrast  10/30/2014   CLINICAL DATA:  69 year old male with history of cough for the past 4 months.  EXAM: CT CHEST WITH CONTRAST  TECHNIQUE: Multidetector CT imaging of the chest was performed during intravenous contrast administration.  CONTRAST:  75mL OMNIPAQUE IOHEXOL 300 MG/ML  SOLN  COMPARISON:  Chest CT 10/19/2012.  FINDINGS: Mediastinum/Lymph Nodes: Heart size is normal. There is no significant pericardial fluid, thickening or pericardial calcification. There is atherosclerosis of the thoracic aorta, the great vessels of the mediastinum and the coronary arteries, including calcified atherosclerotic plaque in the left anterior descending and right coronary arteries. Calcifications of the aortic valve. No pathologically enlarged mediastinal or hilar lymph nodes. Esophagus is unremarkable in appearance. No axillary lymphadenopathy.  Lungs/Pleura: Mild diffuse bronchial wall thickening. Moderate centrilobular and paraseptal emphysema. No acute consolidative airspace disease. No pleural effusions. No suspicious appearing pulmonary nodules or masses. Areas of architectural distortion and noted dependently in the lower lobes of the lungs bilaterally, similar to the prior study, compatible with areas of  mild chronic scarring.  Upper Abdomen: Extensive calcifications throughout the visualized portions of the pancreas, presumably indicative of chronic pancreatitis. Diffuse low attenuation throughout the hepatic parenchyma, indicative of  a background of hepatic steatosis. At the border of segments 7 and 8 in the liver (image 65 of series 2) there is a 2.7 x 2.2 cm lesion which appears accentuated by surrounding fatty liver, with an overall Hounsfield units of approximately 40 within the lesion. This is similar in size in retrospect to prior study 10/19/2012, at which point this also measured approximately 42 HU. More remote prior examination from 10/10/2009 demonstrates a cavernous hemangioma. Atherosclerosis.  Musculoskeletal/Soft Tissues: There are no aggressive appearing lytic or blastic lesions noted in the visualized portions of the skeleton.  IMPRESSION: 1. No acute findings in the thorax. 2. Diffuse bronchial wall thickening with moderate centrilobular and paraseptal emphysema; imaging findings suggestive of underlying COPD. 3. Atherosclerosis, including 2 vessel coronary artery disease. Please note that although the presence of coronary artery calcium documents the presence of coronary artery disease, the severity of this disease and any potential stenosis cannot be assessed on this non-gated CT examination. Assessment for potential risk factor modification, dietary therapy or pharmacologic therapy may be warranted, if clinically indicated. 4. Hepatic steatosis. 5. The appearance of the pancreas suggests chronic pancreatitis. 6. 2.7 x 2.2 cm intermediate attenuation lesion in the right lobe of the liver, as above, very similar compared to prior CT scans dating back to 10/10/2009, at which point this lesion was slightly smaller, but had imaging characteristics compatible with a hemangioma.   Electronically Signed   By: Trudie Reed M.D.   On: 10/30/2014 16:59     IMPRESSION AND PLAN:   69 year old male with past medical history of COPD with ongoing tobacco abuse, hypertension, GERD, alcohol abuse, history of previous seizures who presented to the hospital due to a witnessed seizure.  #1 seizures-patient apparently had a  tonic-clonic seizure which was witnessed by the patient's daughter. -I suspect this is likely an alcohol withdrawal seizure patient's last drink was yesterday morning. -Patient had a CT head and cervical spine which were negative. Patient has been loaded with IV Keppra. I will start the patient on schedule Keppra. Seizure precautions. -Neurology consult, an EEG in the morning.  #2 hyponatremia-this is likely hypotonic hypovolemic hyponatremia. -We'll hydrate the patient with IV fluids and follow sodium. This is likely secondary to patient's alcohol abuse.  #3 hypokalemia-also secondary to his ongoing alcohol abuse and dehydration. -We'll replace potassium accordingly and repeat level. Check magnesium level.  #4 alcohol abuse-patient is at high risk for alcohol withdrawal. -We'll place patient on CIWA protocol.  #5 thrombocytopenia-this is likely secondary to patient's alcohol abuse. -I will check a limited abdominal ultrasound. Will follow platelet count.  #6 BPH-continue doxazosin.  #7 hyperlipidemia-continue atorvastatin.   All the records are reviewed and case discussed with ED provider. Management plans discussed with the patient, family and they are in agreement.  CODE STATUS: Full  TOTAL TIME TAKING CARE OF THIS PATIENT: 50 minutes.    Houston Siren M.D on 10/30/2014 at 7:09 PM  Between 7am to 6pm - Pager - [phone]  After 6pm go to [email or web address] - password [email or web address]  [facility]  Office  [phone]  CC: Primary care physician; Hyman Hopes, MD

## 2014-10-30 NOTE — ED Notes (Addendum)
Pt arrived via EMS from home. Per EMS daughter witnessed patient have 2 seizures.  When EMS arrived patient was not post ictal. Upon arrival to ED patient was alert and able to answer some questions. Uncertain to date, place and situation.  Pt states it has been 2-3 years since last seizure.  Had CT scan at [facility] but is not sure why.

## 2014-10-30 NOTE — Consult Note (Addendum)
CC: seizures  HPI: Larry Castaneda is an 69 y.o. male  with a known history of hypertension, seizures, COPD with ongoing tobacco abuse, GERD who presented to the hospital after a witnessed seizure by his daughter.  Patient was having a tonic-clonic seizure with loss of anywhere from 5-7 minutes as per his daughter. Patient cannot recall these events at all. Post ictal post seizure. Pt has ETOH use and last drink day prior to admission.    Past Medical History  Diagnosis Date  . Hypertension   . Seizures   . COPD (chronic obstructive pulmonary disease)     History reviewed. No pertinent past surgical history.  Family History  Problem Relation Age of Onset  . Alzheimer's disease Father     Social History:  reports that he has been smoking Cigarettes.  He has been smoking about 1.00 pack per day. He has never used smokeless tobacco. He reports that he drinks about 1.2 oz of alcohol per week. He reports that he does not use illicit drugs.  Allergies  Allergen Reactions  . Penicillins Swelling    Medications: I have reviewed the patient's current medications.  ROS: History obtained from the patient  General ROS: negative for - chills, fatigue, fever, night sweats, weight gain or weight loss Psychological ROS: negative for - behavioral disorder, hallucinations, memory difficulties, mood swings or suicidal ideation Ophthalmic ROS: negative for - blurry vision, double vision, eye pain or loss of vision ENT ROS: negative for - epistaxis, nasal discharge, oral lesions, sore throat, tinnitus or vertigo Allergy and Immunology ROS: negative for - hives or itchy/watery eyes Hematological and Lymphatic ROS: negative for - bleeding problems, bruising or swollen lymph nodes Endocrine ROS: negative for - galactorrhea, hair pattern changes, polydipsia/polyuria or temperature intolerance Respiratory ROS: negative for - cough, hemoptysis, shortness of breath or wheezing Cardiovascular ROS:  negative for - chest pain, dyspnea on exertion, edema or irregular heartbeat Gastrointestinal ROS: negative for - abdominal pain, diarrhea, hematemesis, nausea/vomiting or stool incontinence Genito-Urinary ROS: negative for - dysuria, hematuria, incontinence or urinary frequency/urgency Musculoskeletal ROS: negative for - joint swelling or muscular weakness Neurological ROS: as noted in HPI Dermatological ROS: negative for rash and skin lesion changes  Physical Examination: Blood pressure 151/81, pulse 67, temperature 97.8 F (36.6 C), temperature source Oral, resp. rate 18, height  (1.854 m), weight 63.095 kg (139 lb 1.6 oz), SpO2 98 %.  Neurological Examination Mental Status: Alert, oriented, thought content appropriate.  Speech fluent without evidence of aphasia.  Able to follow 3 step commands without difficulty. Cranial Nerves: II: Discs flat bilaterally; Visual fields grossly normal, pupils equal, round, reactive to light and accommodation III,IV, VI: ptosis not present, extra-ocular motions intact bilaterally V,VII: smile symmetric, facial light touch sensation normal bilaterally VIII: hearing normal bilaterally IX,X: gag reflex present XI: bilateral shoulder shrug XII: midline tongue extension Motor: Right : Upper extremity   5/5    Left:     Upper extremity   5/5  Lower extremity   5/5     Lower extremity   5/5 Tone and bulk:normal tone throughout; no atrophy noted Sensory: Pinprick and light touch intact throughout, bilaterally Deep Tendon Reflexes: 2+ and symmetric throughout Plantars: Right: downgoing   Left: downgoing Cerebellar: normal finger-to-nose, normal rapid alternating movements and normal heel-to-shin test Gait: normal gait and station      Laboratory Studies:   Basic Metabolic Panel:  Recent Labs Lab 10/30/14 1551  NA 129*  K 2.3*  CL 91*  CO2 21*  GLUCOSE 135*  BUN 6  CREATININE 0.76  CALCIUM 9.4  MG 1.0*    Liver Function Tests: No  results for input(s): AST, ALT, ALKPHOS, BILITOT, PROT, ALBUMIN in the last 168 hours. No results for input(s): LIPASE, AMYLASE in the last 168 hours. No results for input(s): AMMONIA in the last 168 hours.  CBC:  Recent Labs Lab 10/30/14 1551  WBC 5.8  NEUTROABS 4.0  HGB 11.8*  HCT 35.3*  MCV 90.1  PLT 78*    Cardiac Enzymes: No results for input(s): CKTOTAL, CKMB, CKMBINDEX, TROPONINI in the last 168 hours.  BNP: Invalid input(s): POCBNP  CBG: No results for input(s): GLUCAP in the last 168 hours.  Microbiology: No results found for this or any previous visit.  Coagulation Studies: No results for input(s): LABPROT, INR in the last 72 hours.  Urinalysis: No results for input(s): COLORURINE, LABSPEC, PHURINE, GLUCOSEU, HGBUR, BILIRUBINUR, KETONESUR, PROTEINUR, UROBILINOGEN, NITRITE, LEUKOCYTESUR in the last 168 hours.  Invalid input(s): APPERANCEUR  Lipid Panel:  No results found for: CHOL, TRIG, HDL, CHOLHDL, VLDL, LDLCALC  HgbA1C: No results found for: HGBA1C  Urine Drug Screen:     Component Value Date/Time   LABOPIA NONE DETECTED 10/30/2014 1851   LABBENZ NONE DETECTED 10/30/2014 1851   AMPHETMU NONE DETECTED 10/30/2014 1851   THCU NONE DETECTED 10/30/2014 1851   LABBARB NONE DETECTED 10/30/2014 1851    Alcohol Level:  Recent Labs Lab 10/30/14 1551  ETH <5    Other results: EKG: normal EKG, normal sinus rhythm, unchanged from previous tracings.  Imaging: Ct Head Wo Contrast  10/30/2014   CLINICAL DATA:  Seizures.  EXAM: CT HEAD WITHOUT CONTRAST  TECHNIQUE: Contiguous axial images were obtained from the base of the skull through the vertex without intravenous contrast.  COMPARISON:  MRI brain 04/08/2013.  FINDINGS: Mild periventricular white matter hypoattenuation is similar to the prior studies. No acute infarct, hemorrhage, or mass lesion is present. Mild generalized atrophy is within normal limits for age. The ventricles are of normal size. No  significant extra-axial fluid collection is present.  A single left ethmoid air cell is opacified. The paranasal sinuses and mastoid air cells are otherwise clear.  IMPRESSION: 1. No acute intracranial abnormality or significant interval change. 2. Stable atrophy and white matter disease.   Electronically Signed   By: Marin Roberts M.D.   On: 10/30/2014 16:45   Ct Chest W Contrast  10/30/2014   CLINICAL DATA:  69 year old male with history of cough for the past 4 months.  EXAM: CT CHEST WITH CONTRAST  TECHNIQUE: Multidetector CT imaging of the chest was performed during intravenous contrast administration.  CONTRAST:  75mL OMNIPAQUE IOHEXOL 300 MG/ML  SOLN  COMPARISON:  Chest CT 10/19/2012.  FINDINGS: Mediastinum/Lymph Nodes: Heart size is normal. There is no significant pericardial fluid, thickening or pericardial calcification. There is atherosclerosis of the thoracic aorta, the great vessels of the mediastinum and the coronary arteries, including calcified atherosclerotic plaque in the left anterior descending and right coronary arteries. Calcifications of the aortic valve. No pathologically enlarged mediastinal or hilar lymph nodes. Esophagus is unremarkable in appearance. No axillary lymphadenopathy.  Lungs/Pleura: Mild diffuse bronchial wall thickening. Moderate centrilobular and paraseptal emphysema. No acute consolidative airspace disease. No pleural effusions. No suspicious appearing pulmonary nodules or masses. Areas of architectural distortion and noted dependently in the lower lobes of the lungs bilaterally, similar to the prior study, compatible with areas of mild chronic scarring.  Upper Abdomen:  Extensive calcifications throughout the visualized portions of the pancreas, presumably indicative of chronic pancreatitis. Diffuse low attenuation throughout the hepatic parenchyma, indicative of a background of hepatic steatosis. At the border of segments 7 and 8 in the liver (image 65 of series 2)  there is a 2.7 x 2.2 cm lesion which appears accentuated by surrounding fatty liver, with an overall Hounsfield units of approximately 40 within the lesion. This is similar in size in retrospect to prior study 10/19/2012, at which point this also measured approximately 42 HU. More remote prior examination from 10/10/2009 demonstrates a cavernous hemangioma. Atherosclerosis.  Musculoskeletal/Soft Tissues: There are no aggressive appearing lytic or blastic lesions noted in the visualized portions of the skeleton.  IMPRESSION: 1. No acute findings in the thorax. 2. Diffuse bronchial wall thickening with moderate centrilobular and paraseptal emphysema; imaging findings suggestive of underlying COPD. 3. Atherosclerosis, including 2 vessel coronary artery disease. Please note that although the presence of coronary artery calcium documents the presence of coronary artery disease, the severity of this disease and any potential stenosis cannot be assessed on this non-gated CT examination. Assessment for potential risk factor modification, dietary therapy or pharmacologic therapy may be warranted, if clinically indicated. 4. Hepatic steatosis. 5. The appearance of the pancreas suggests chronic pancreatitis. 6. 2.7 x 2.2 cm intermediate attenuation lesion in the right lobe of the liver, as above, very similar compared to prior CT scans dating back to 10/10/2009, at which point this lesion was slightly smaller, but had imaging characteristics compatible with a hemangioma.   Electronically Signed   By: Trudie Reed M.D.   On: 10/30/2014 16:59     Assessment/Plan: Larry Castaneda is an 69 y.o. male  with a known history of hypertension, seizures, COPD with ongoing tobacco abuse, GERD who presented to the hospital after a witnessed seizure by his daughter.  Patient was having a tonic-clonic seizure with loss of anywhere from 5-7 minutes as per his daughter. Patient cannot recall these events at all. Post ictal post  seizure. Pt has ETOH use and last drink day prior to admission.   Started on Keppra, not first seizure was suspected to be on dilantin but was not taken it.   Cause likely due to ETOH withdrawal and hyponatremia - Now back to baseline - con't Keppra  - no need for EEG  - hydration, electrolyte imbalance - likely d/c planning tomorrowg - thiamine, folate - CIWA protocol 10/30/2014, 9:55 PM

## 2014-10-30 NOTE — ED Notes (Signed)
Daughter at bedside states she witnessed the seizures and he has had a seizure within the past 3-6 months.  Today he fell back and hit his head on the grass.  He is supposed to be on Dilantin suspension. Today he was seen for CT scan of the chest due to new diagnosis of COPD.

## 2014-10-31 ENCOUNTER — Inpatient Hospital Stay: Payer: Medicare Other

## 2014-10-31 LAB — BASIC METABOLIC PANEL
Anion gap: 11 (ref 5–15)
BUN: 5 mg/dL — AB (ref 6–20)
CO2: 23 mmol/L (ref 22–32)
Calcium: 9.3 mg/dL (ref 8.9–10.3)
Chloride: 101 mmol/L (ref 101–111)
Creatinine, Ser: 0.68 mg/dL (ref 0.61–1.24)
Glucose, Bld: 87 mg/dL (ref 65–99)
Potassium: 3 mmol/L — ABNORMAL LOW (ref 3.5–5.1)
Sodium: 135 mmol/L (ref 135–145)

## 2014-10-31 LAB — CBC
HEMATOCRIT: 35.5 % — AB (ref 40.0–52.0)
Hemoglobin: 12 g/dL — ABNORMAL LOW (ref 13.0–18.0)
MCH: 30.6 pg (ref 26.0–34.0)
MCHC: 33.9 g/dL (ref 32.0–36.0)
MCV: 90.5 fL (ref 80.0–100.0)
PLATELETS: 77 10*3/uL — AB (ref 150–440)
RBC: 3.93 MIL/uL — ABNORMAL LOW (ref 4.40–5.90)
RDW: 22.1 % — ABNORMAL HIGH (ref 11.5–14.5)
WBC: 6.1 10*3/uL (ref 3.8–10.6)

## 2014-10-31 MED ORDER — LEVETIRACETAM 500 MG PO TABS: 500.0000 mg | ORAL_TABLET | Freq: Two times a day (BID) | ORAL | Status: DC

## 2014-10-31 MED ORDER — NICOTINE 14 MG/24HR TD PT24: 14.0000 mg | MEDICATED_PATCH | Freq: Every day | TRANSDERMAL | Status: DC

## 2014-10-31 NOTE — Discharge Summary (Signed)
[facility] - [place] at [facility]   PATIENT NAME: Larry Castaneda    MR#:  [id number]  DATE OF BIRTH:  10-08-1945  DATE OF ADMISSION:  10/30/2014 ADMITTING PHYSICIAN: Houston Siren, MD  DATE OF DISCHARGE: 10/31/2014 PRIMARY CARE PHYSICIAN: Hyman Hopes, MD    ADMISSION DIAGNOSIS:  Hypokalemia [E87.6] Delirium tremens [F10.231] Seizure [R56.9] Thrombocytopenia [D69.6]  DISCHARGE DIAGNOSIS:  Active Problems:   Seizures   SECONDARY DIAGNOSIS:   Past Medical History  Diagnosis Date  . Hypertension   . Seizures   . COPD (chronic obstructive pulmonary disease)     HOSPITAL COURSE:  69 year old male with a known history of hypertension, seizures, COPD who presented with a witnessed seizure. For further details please refer the H&P.  1. Tonic-clonic seizure: I suspect this to be alcohol induced seizure. Patient's last drink was the day before admission. CT the head and cervical spine showed no acute etiology. Patient was seen and evaluated by neurology. Patient had no seizures while in the hospital. Patient was initially loaded with IV Keppra. Patient will be discharged on by mouth Keppra.  2. Hyponatremia: This is secondary to EtOH abuse. Likely hypotonic hypovolemic hyponatremia. Sodium remained stable.  3. Hypokalemia: Secondary to EtOH abuse. Potassium level was repleted and his improved.  4. EtOH abuse: Patient did not go through active child this time.  5. Thombocytopenia: Secondary to EtOH abuse. Abdominal ultrasound was performed.  6. BPH: Patient continued oxytocin  7.  Hyperlipidemia: Patient can continue atorvastatin 8. Tobacco events: Patient was counseled about quitting smoking. Patient is counseled for 3 minutes. Patient will be discharged and nicotine patch.   DISCHARGE CONDITIONS AND DIET:  Patient is being discharged on a heart healthy diet in stable condition  CONSULTS OBTAINED:  Treatment Team:  Pauletta Browns,  MD  DRUG ALLERGIES:   Allergies  Allergen Reactions  . Penicillins Swelling    DISCHARGE MEDICATIONS:   Current Discharge Medication List    START taking these medications   Details  levETIRAcetam (KEPPRA) 500 MG tablet Take 1 tablet (500 mg total) by mouth 2 (two) times daily. Qty: 60 tablet, Refills: 0    nicotine (NICODERM CQ - DOSED IN MG/24 HOURS) 14 mg/24hr patch Place 1 patch (14 mg total) onto the skin daily. Qty: 28 patch, Refills: 0      CONTINUE these medications which have NOT CHANGED   Details  atorvastatin (LIPITOR) 20 MG tablet Take 20 mg by mouth every evening.    doxazosin (CARDURA) 2 MG tablet Take 2 mg by mouth at bedtime.    hydrochlorothiazide (HYDRODIURIL) 25 MG tablet Take 25 mg by mouth daily.    omeprazole (PRILOSEC) 40 MG capsule Take 40 mg by mouth daily.              Today   CHIEF COMPLAINT:  No issues overnight. No seizures  VITAL SIGNS:  Blood pressure 150/87, pulse 62, temperature 97.5 F (36.4 C), temperature source Oral, resp. rate 14, height  (1.854 m), weight 63.095 kg (139 lb 1.6 oz), SpO2 99 %.   REVIEW OF SYSTEMS:  Review of Systems  Constitutional: Negative for fever, chills and malaise/fatigue.  HENT: Negative for sore throat.   Eyes: Negative for blurred vision.  Respiratory: Negative for cough, hemoptysis, shortness of breath and wheezing.   Cardiovascular: Negative for chest pain, palpitations and leg swelling.  Gastrointestinal: Negative for nausea, vomiting, abdominal pain, diarrhea and blood in stool.  Genitourinary: Negative for dysuria.  Musculoskeletal:  Negative for back pain.  Neurological: Negative for dizziness, tremors and headaches.  Endo/Heme/Allergies: Does not bruise/bleed easily.     PHYSICAL EXAMINATION:  GENERAL:  69 y.o.-year-old patient lying in the bed with no acute distress.  NECK:  Supple, no jugular venous distention. No thyroid enlargement, no tenderness.  LUNGS: Normal  breath sounds bilaterally, no wheezing, rales,rhonchi  No use of accessory muscles of respiration.  CARDIOVASCULAR: S1, S2 normal. No murmurs, rubs, or gallops.  ABDOMEN: Soft, non-tender, non-distended. Bowel sounds present. No organomegaly or mass.  EXTREMITIES: No pedal edema, cyanosis, or clubbing.  PSYCHIATRIC: The patient is alert and oriented x 3. No tremors SKIN: No obvious rash, lesion, or ulcer.   DATA REVIEW:   CBC  Recent Labs Lab 10/31/14 0536  WBC 6.1  HGB 12.0*  HCT 35.5*  PLT 77*    Chemistries   Recent Labs Lab 10/30/14 1551 10/31/14 0536  NA 129* 135  K 2.3* 3.0*  CL 91* 101  CO2 21* 23  GLUCOSE 135* 87  BUN 6 5*  CREATININE 0.76 0.68  CALCIUM 9.4 9.3  MG 1.0*  --     Cardiac Enzymes No results for input(s): TROPONINI in the last 168 hours.  Microbiology Results  @MICRORSLT48 @  RADIOLOGY:  Ct Head Wo Contrast  10/30/2014   CLINICAL DATA:  Seizures.  EXAM: CT HEAD WITHOUT CONTRAST  TECHNIQUE: Contiguous axial images were obtained from the base of the skull through the vertex without intravenous contrast.  COMPARISON:  MRI brain 04/08/2013.  FINDINGS: Mild periventricular white matter hypoattenuation is similar to the prior studies. No acute infarct, hemorrhage, or mass lesion is present. Mild generalized atrophy is within normal limits for age. The ventricles are of normal size. No significant extra-axial fluid collection is present.  A single left ethmoid air cell is opacified. The paranasal sinuses and mastoid air cells are otherwise clear.  IMPRESSION: 1. No acute intracranial abnormality or significant interval change. 2. Stable atrophy and white matter disease.   Electronically Signed   By: Marin Roberts M.D.   On: 10/30/2014 16:45   Ct Chest W Contrast  10/30/2014     IMPRESSION: 1. No acute findings in the thorax. 2. Diffuse bronchial wall thickening with moderate centrilobular and paraseptal emphysema; imaging findings suggestive of  underlying COPD. 3. Atherosclerosis, including 2 vessel coronary artery disease. Please note that although the presence of coronary artery calcium documents the presence of coronary artery disease, the severity of this disease and any potential stenosis cannot be assessed on this non-gated CT examination. Assessment for potential risk factor modification, dietary therapy or pharmacologic therapy may be warranted, if clinically indicated. 4. Hepatic steatosis. 5. The appearance of the pancreas suggests chronic pancreatitis. 6. 2.7 x 2.2 cm intermediate attenuation lesion in the right lobe of the liver, as above, very similar compared to prior CT scans dating back to 10/10/2009, at which point this lesion was slightly smaller, but had imaging characteristics compatible with a hemangioma.   Electronically Signed   By: Trudie Reed M.D.   On: 10/30/2014 16:59   US Abdomen Complete  10/31/2014     IMPRESSION: Calcifications throughout the pancreas compatible with chronic pancreatitis.  No acute findings.   Electronically Signed   By: Charlett Nose M.D.   On: 10/31/2014 10:57      Management plans discussed with the patient and he is in agreement. Stable for discharge home  Patient should follow up with neurology in 4 weeks and PCP in one  week  CODE STATUS:     Code Status Orders        Start     Ordered   10/30/14 2151  Full code   Continuous     10/30/14 2150      TOTAL TIME TAKING CARE OF THIS PATIENT: 35 minutes.    Kursten Kruk M.D on 10/31/2014 at 12:15 PM  Between 7am to 6pm - Pager - [phone] After 6pm go to [email or web address] - password [email or web address] [facility]  [facility]  Office  [phone]  CC: Primary care physician; Hyman Hopes, MD

## 2014-10-31 NOTE — Discharge Instructions (Signed)
Take all medications as prescribed.  Do not stop taking your seizure medications.  Stop drinking alcohol.   Alcohol Withdrawal Anytime drug use is interfering with normal living activities it has become abuse. This includes problems with family and friends. Psychological dependence has developed when your mind tells you that the drug is needed. This is usually followed by physical dependence when a continuing increase of drugs are required to get the same feeling or "high." This is known as addiction or chemical dependency. A person's risk is much higher if there is a history of chemical dependency in the family. Mild Withdrawal Following Stopping Alcohol, When Addiction or Chemical Dependency Has Developed When a person has developed tolerance to alcohol, any sudden stopping of alcohol can cause uncomfortable physical symptoms. Most of the time these are mild and consist of tremors in the hands and increases in heart rate, breathing, and temperature. Sometimes these symptoms are associated with anxiety, panic attacks, and bad dreams. There may also be stomach upset. Normal sleep patterns are often interrupted with periods of inability to sleep (insomnia). This may last for 6 months. Because of this discomfort, many people choose to continue drinking to get rid of this discomfort and to try to feel normal. Severe Withdrawal with Decreased or No Alcohol Intake, When Addiction or Chemical Dependency Has Developed About five percent of alcoholics will develop signs of severe withdrawal when they stop using alcohol. One sign of this is development of generalized seizures (convulsions). Other signs of this are severe agitation and confusion. This may be associated with believing in things which are not real or seeing things which are not really there (delusions and hallucinations). Vitamin deficiencies are usually present if alcohol intake has been long-term. Treatment for this most often requires hospitalization  and close observation. Addiction can only be helped by stopping use of all chemicals. This is hard but may save your life. With continual alcohol use, possible outcomes are usually loss of self respect and esteem, violence, and death. Addiction cannot be cured but it can be stopped. This often requires outside help and the care of professionals. Treatment centers are listed in the yellow pages under Cocaine, Narcotics, and Alcoholics Anonymous. Most hospitals and clinics can refer you to a specialized care center. It is not necessary for you to go through the uncomfortable symptoms of withdrawal. Your caregiver can provide you with medicines that will help you through this difficult period. Try to avoid situations, friends, or drugs that made it possible for you to keep using alcohol in the past. Learn how to say no. It takes a long period of time to overcome addictions to all drugs, including alcohol. There may be many times when you feel as though you want a drink. After getting rid of the physical addiction and withdrawal, you will have a lessening of the craving which tells you that you need alcohol to feel normal. Call your caregiver if more support is needed. Learn who to talk to in your family and among your friends so that during these periods you can receive outside help. Alcoholics Anonymous (AA) has helped many people over the years. To get further help, contact AA or call your caregiver, counselor, or clergyperson. Al-Anon and Alateen are support groups for friends and family members of an alcoholic. The people who love and care for an alcoholic often need help, too. For information about these organizations, check your phone directory or call a local alcoholism treatment center.  SEEK IMMEDIATE MEDICAL CARE  IF:   You have a seizure.  You have a fever.  You experience uncontrolled vomiting or you vomit up blood. This may be bright red or look like black coffee grounds.  You have blood in the  stool. This may be bright red or appear as a black, tarry, bad-smelling stool.  You become lightheaded or faint. Do not drive if you feel this way. Have someone else drive you or call 161 for help.  You become more agitated or confused.  You develop uncontrolled anxiety.  You begin to see things that are not really there (hallucinate). Your caregiver has determined that you completely understand your medical condition, and that your mental state is back to normal. You understand that you have been treated for alcohol withdrawal, have agreed not to drink any alcohol for a minimum of 1 day, will not operate a car or other machinery for 24 hours, and have had an opportunity to ask any questions about your condition. Document Released: 12/22/2004 Document Revised: 06/06/2011 Document Reviewed: 10/31/2007 [facility] Patient Information 2015 [place], [place]. This information is not intended to replace advice given to you by your health care provider. Make sure you discuss any questions you have with your health care provider.

## 2014-10-31 NOTE — Progress Notes (Signed)
Pt d/c home; d/c instructions reviewed w/ pt; pt understanding was verbalized; IV removed catheter in tact, gauze dressing applied; all pt questions answered; pt left unit via wheelchair accompanied by staff 

## 2014-10-31 NOTE — Care Management (Signed)
Patient presents with seizures. Patient states that he is from home and lives with his girlfriend.  He is a patient of the [facility] and utilizes the pharmacy there. Patient states that he has a walker at home, but does not have the need for it.  He states that he is able to obtain his medication and has been taking it as prescribed.  No anticipated RN CM needs

## 2014-12-23 ENCOUNTER — Other Ambulatory Visit: Payer: Self-pay | Admitting: Internal Medicine

## 2014-12-23 DIAGNOSIS — R0602 Shortness of breath: Secondary | ICD-10-CM

## 2014-12-31 ENCOUNTER — Ambulatory Visit: Admission: RE | Admit: 2014-12-31 | Payer: Medicare Other | Source: Ambulatory Visit

## 2015-01-08 ENCOUNTER — Ambulatory Visit: Payer: Medicare Other

## 2015-12-23 ENCOUNTER — Emergency Department: Payer: Medicare Other

## 2015-12-23 ENCOUNTER — Encounter: Payer: Self-pay | Admitting: Emergency Medicine

## 2015-12-23 DIAGNOSIS — G40909 Epilepsy, unspecified, not intractable, without status epilepticus: Secondary | ICD-10-CM | POA: Diagnosis not present

## 2015-12-23 DIAGNOSIS — R269 Unspecified abnormalities of gait and mobility: Secondary | ICD-10-CM | POA: Insufficient documentation

## 2015-12-23 DIAGNOSIS — I471 Supraventricular tachycardia: Secondary | ICD-10-CM | POA: Diagnosis not present

## 2015-12-23 DIAGNOSIS — N400 Enlarged prostate without lower urinary tract symptoms: Secondary | ICD-10-CM | POA: Diagnosis not present

## 2015-12-23 DIAGNOSIS — F17210 Nicotine dependence, cigarettes, uncomplicated: Secondary | ICD-10-CM | POA: Insufficient documentation

## 2015-12-23 DIAGNOSIS — J449 Chronic obstructive pulmonary disease, unspecified: Secondary | ICD-10-CM | POA: Diagnosis not present

## 2015-12-23 DIAGNOSIS — M7989 Other specified soft tissue disorders: Secondary | ICD-10-CM | POA: Diagnosis present

## 2015-12-23 DIAGNOSIS — Z820 Family history of epilepsy and other diseases of the nervous system: Secondary | ICD-10-CM | POA: Insufficient documentation

## 2015-12-23 DIAGNOSIS — Z880 Allergy status to penicillin: Secondary | ICD-10-CM | POA: Diagnosis not present

## 2015-12-23 DIAGNOSIS — I429 Cardiomyopathy, unspecified: Secondary | ICD-10-CM | POA: Insufficient documentation

## 2015-12-23 DIAGNOSIS — R600 Localized edema: Secondary | ICD-10-CM | POA: Insufficient documentation

## 2015-12-23 DIAGNOSIS — R42 Dizziness and giddiness: Secondary | ICD-10-CM | POA: Diagnosis not present

## 2015-12-23 DIAGNOSIS — E876 Hypokalemia: Secondary | ICD-10-CM | POA: Diagnosis present

## 2015-12-23 DIAGNOSIS — F1010 Alcohol abuse, uncomplicated: Secondary | ICD-10-CM | POA: Diagnosis not present

## 2015-12-23 DIAGNOSIS — I10 Essential (primary) hypertension: Secondary | ICD-10-CM | POA: Diagnosis not present

## 2015-12-23 DIAGNOSIS — R531 Weakness: Secondary | ICD-10-CM | POA: Diagnosis not present

## 2015-12-23 LAB — URINALYSIS COMPLETE WITH MICROSCOPIC (ARMC ONLY)
Bacteria, UA: NONE SEEN
Bilirubin Urine: NEGATIVE
GLUCOSE, UA: NEGATIVE mg/dL
HGB URINE DIPSTICK: NEGATIVE
KETONES UR: NEGATIVE mg/dL
NITRITE: NEGATIVE
Protein, ur: NEGATIVE mg/dL
RBC / HPF: NONE SEEN RBC/hpf (ref 0–5)
SPECIFIC GRAVITY, URINE: 1.002 — AB (ref 1.005–1.030)
Squamous Epithelial / LPF: NONE SEEN
pH: 8 (ref 5.0–8.0)

## 2015-12-23 LAB — CBC
HCT: 38.7 % — ABNORMAL LOW (ref 40.0–52.0)
HEMOGLOBIN: 13.1 g/dL (ref 13.0–18.0)
MCH: 32.5 pg (ref 26.0–34.0)
MCHC: 33.8 g/dL (ref 32.0–36.0)
MCV: 96.3 fL (ref 80.0–100.0)
PLATELETS: 133 10*3/uL — AB (ref 150–440)
RBC: 4.02 MIL/uL — AB (ref 4.40–5.90)
RDW: 15.2 % — ABNORMAL HIGH (ref 11.5–14.5)
WBC: 8.6 10*3/uL (ref 3.8–10.6)

## 2015-12-23 NOTE — ED Triage Notes (Addendum)
Pt to triage via w/c with no distress noted; Pt reports swelling to feet x 2-3 weeks, prod cough white sputum; st tonight became dizzy

## 2015-12-24 ENCOUNTER — Encounter: Payer: Self-pay | Admitting: Internal Medicine

## 2015-12-24 ENCOUNTER — Observation Stay: Admit: 2015-12-24 | Payer: Medicare Other

## 2015-12-24 ENCOUNTER — Observation Stay
Admit: 2015-12-24 | Discharge: 2015-12-24 | Disposition: A | Discharge status: Home / Self Care | Payer: Medicare Other | Attending: Internal Medicine | Admitting: Internal Medicine

## 2015-12-24 ENCOUNTER — Observation Stay
Admission: EM | Admit: 2015-12-24 | Discharge: 2015-12-25 | Disposition: A | Discharge status: Home / Self Care | Payer: Medicare Other | Attending: Specialist | Admitting: Specialist

## 2015-12-24 DIAGNOSIS — M7989 Other specified soft tissue disorders: Secondary | ICD-10-CM

## 2015-12-24 DIAGNOSIS — E876 Hypokalemia: Secondary | ICD-10-CM | POA: Diagnosis present

## 2015-12-24 DIAGNOSIS — R600 Localized edema: Secondary | ICD-10-CM | POA: Diagnosis present

## 2015-12-24 DIAGNOSIS — R42 Dizziness and giddiness: Secondary | ICD-10-CM

## 2015-12-24 LAB — BASIC METABOLIC PANEL
ANION GAP: 13 (ref 5–15)
Anion gap: 10 (ref 5–15)
BUN: <5 mg/dL — ABNORMAL LOW (ref 6–20)
CALCIUM: 8.5 mg/dL — AB (ref 8.9–10.3)
CHLORIDE: 94 mmol/L — AB (ref 101–111)
CO2: 29 mmol/L (ref 22–32)
CO2: 33 mmol/L — AB (ref 22–32)
CREATININE: 0.48 mg/dL — AB (ref 0.61–1.24)
Calcium: 8.4 mg/dL — ABNORMAL LOW (ref 8.9–10.3)
Chloride: 96 mmol/L — ABNORMAL LOW (ref 101–111)
Creatinine, Ser: 0.53 mg/dL — ABNORMAL LOW (ref 0.61–1.24)
GFR calc Af Amer: >60 mL/min (ref >60)
GFR calc non Af Amer: >60 mL/min (ref >60)
GFR calc non Af Amer: >60 mL/min (ref >60)
GLUCOSE: 94 mg/dL (ref 65–99)
Glucose, Bld: 112 mg/dL — ABNORMAL HIGH (ref 65–99)
Potassium: 2.3 mmol/L — CL (ref 3.5–5.1)
Potassium: 2.8 mmol/L — ABNORMAL LOW (ref 3.5–5.1)
SODIUM: 136 mmol/L (ref 135–145)
Sodium: 139 mmol/L (ref 135–145)

## 2015-12-24 LAB — MAGNESIUM
MAGNESIUM: 2.3 mg/dL (ref 1.7–2.4)
Magnesium: 1 mg/dL — ABNORMAL LOW (ref 1.7–2.4)

## 2015-12-24 LAB — CBC
HCT: 38.6 % — ABNORMAL LOW (ref 40.0–52.0)
Hemoglobin: 13.2 g/dL (ref 13.0–18.0)
MCH: 32.8 pg (ref 26.0–34.0)
MCHC: 34.3 g/dL (ref 32.0–36.0)
MCV: 95.4 fL (ref 80.0–100.0)
PLATELETS: 132 10*3/uL — AB (ref 150–440)
RBC: 4.04 MIL/uL — ABNORMAL LOW (ref 4.40–5.90)
RDW: 15.4 % — ABNORMAL HIGH (ref 11.5–14.5)
WBC: 7.7 10*3/uL (ref 3.8–10.6)

## 2015-12-24 LAB — TROPONIN I
TROPONIN I: 0.03 ng/mL — AB (ref <0.03)
TROPONIN I: 0.04 ng/mL — AB (ref <0.03)
TROPONIN I: 0.04 ng/mL — AB (ref <0.03)
TROPONIN I: 0.04 ng/mL — AB (ref <0.03)

## 2015-12-24 LAB — TSH: TSH: 1.593 u[IU]/mL (ref 0.350–4.500)

## 2015-12-24 LAB — ECHOCARDIOGRAM COMPLETE
Height: 73 in
Weight: 2036.8 oz

## 2015-12-24 LAB — BRAIN NATRIURETIC PEPTIDE: B Natriuretic Peptide: 30 pg/mL (ref 0.0–100.0)

## 2015-12-24 MED ORDER — PHENYTOIN SODIUM EXTENDED 100 MG PO CAPS
100.0000 mg | ORAL_CAPSULE | Freq: Two times a day (BID) | ORAL | Status: DC
  Administered 2015-12-24 – 2015-12-25 (×3): 100 mg via ORAL
  Filled 2015-12-24 (×3): qty 1

## 2015-12-24 MED ORDER — LORAZEPAM 1 MG PO TABS
0.0000 mg | ORAL_TABLET | Freq: Two times a day (BID) | ORAL | Status: DC
Start: 2015-12-26 — End: 2015-12-25

## 2015-12-24 MED ORDER — POTASSIUM CHLORIDE CRYS ER 20 MEQ PO TBCR
40.0000 meq | EXTENDED_RELEASE_TABLET | Freq: Once | ORAL | Status: AC
  Administered 2015-12-24: 40 meq via ORAL
  Filled 2015-12-24: qty 2

## 2015-12-24 MED ORDER — MAGNESIUM SULFATE 2 GM/50ML IV SOLN
2.0000 g | Freq: Once | INTRAVENOUS | Status: AC
  Administered 2015-12-24: 2 g via INTRAVENOUS
  Filled 2015-12-24: qty 50

## 2015-12-24 MED ORDER — SODIUM CHLORIDE 0.9% FLUSH: 3.0000 mL | Freq: Two times a day (BID) | INTRAVENOUS | Status: DC

## 2015-12-24 MED ORDER — DOXAZOSIN MESYLATE 2 MG PO TABS
2.0000 mg | ORAL_TABLET | Freq: Every day | ORAL | Status: DC
  Administered 2015-12-24: 2 mg via ORAL
  Filled 2015-12-24: qty 1

## 2015-12-24 MED ORDER — THIAMINE HCL 100 MG/ML IJ SOLN
100.0000 mg | Freq: Every day | INTRAMUSCULAR | Status: DC
  Filled 2015-12-24 (×2): qty 1

## 2015-12-24 MED ORDER — SODIUM CHLORIDE 0.9 % IV SOLN: 250.0000 mL | INTRAVENOUS | Status: DC | PRN

## 2015-12-24 MED ORDER — ACETAMINOPHEN 325 MG PO TABS: 650.0000 mg | ORAL_TABLET | Freq: Four times a day (QID) | ORAL | Status: DC | PRN

## 2015-12-24 MED ORDER — INFLUENZA VAC SPLIT QUAD 0.5 ML IM SUSY
0.5000 mL | PREFILLED_SYRINGE | INTRAMUSCULAR | Status: AC
  Administered 2015-12-25: 0.5 mL via INTRAMUSCULAR
  Filled 2015-12-24: qty 0.5

## 2015-12-24 MED ORDER — LEVETIRACETAM 500 MG PO TABS
500.0000 mg | ORAL_TABLET | Freq: Two times a day (BID) | ORAL | Status: DC
  Administered 2015-12-24: 500 mg via ORAL
  Filled 2015-12-24: qty 1

## 2015-12-24 MED ORDER — NICOTINE 14 MG/24HR TD PT24
14.0000 mg | MEDICATED_PATCH | Freq: Every day | TRANSDERMAL | Status: DC
  Filled 2015-12-24: qty 1

## 2015-12-24 MED ORDER — ENOXAPARIN SODIUM 40 MG/0.4ML ~~LOC~~ SOLN
40.0000 mg | SUBCUTANEOUS | Status: DC
  Administered 2015-12-24: 40 mg via SUBCUTANEOUS
  Filled 2015-12-24: qty 0.4

## 2015-12-24 MED ORDER — ADULT MULTIVITAMIN W/MINERALS CH
1.0000 | ORAL_TABLET | Freq: Every day | ORAL | Status: DC
  Administered 2015-12-24 – 2015-12-25 (×2): 1 via ORAL
  Filled 2015-12-24 (×2): qty 1

## 2015-12-24 MED ORDER — SODIUM CHLORIDE 0.9% FLUSH: 3.0000 mL | INTRAVENOUS | Status: DC | PRN

## 2015-12-24 MED ORDER — SENNOSIDES-DOCUSATE SODIUM 8.6-50 MG PO TABS: 1.0000 | ORAL_TABLET | Freq: Every evening | ORAL | Status: DC | PRN

## 2015-12-24 MED ORDER — LORAZEPAM 1 MG PO TABS
0.0000 mg | ORAL_TABLET | Freq: Four times a day (QID) | ORAL | Status: DC
  Administered 2015-12-25: 1 mg via ORAL

## 2015-12-24 MED ORDER — FOLIC ACID 1 MG PO TABS
1.0000 mg | ORAL_TABLET | Freq: Every day | ORAL | Status: DC
  Filled 2015-12-24: qty 1

## 2015-12-24 MED ORDER — SODIUM CHLORIDE 0.9% FLUSH
3.0000 mL | Freq: Two times a day (BID) | INTRAVENOUS | Status: DC
  Administered 2015-12-24 – 2015-12-25 (×3): 3 mL via INTRAVENOUS

## 2015-12-24 MED ORDER — HEPARIN SODIUM (PORCINE) 5000 UNIT/ML IJ SOLN
5000.0000 [IU] | Freq: Three times a day (TID) | INTRAMUSCULAR | Status: DC
  Administered 2015-12-24: 5000 [IU] via SUBCUTANEOUS
  Filled 2015-12-24 (×2): qty 1

## 2015-12-24 MED ORDER — LORAZEPAM 2 MG/ML IJ SOLN: 1.0000 mg | Freq: Four times a day (QID) | INTRAMUSCULAR | Status: DC | PRN

## 2015-12-24 MED ORDER — POTASSIUM CHLORIDE CRYS ER 20 MEQ PO TBCR
20.0000 meq | EXTENDED_RELEASE_TABLET | Freq: Three times a day (TID) | ORAL | Status: DC
  Administered 2015-12-24 – 2015-12-25 (×4): 20 meq via ORAL
  Filled 2015-12-24 (×4): qty 1

## 2015-12-24 MED ORDER — ACETAMINOPHEN 650 MG RE SUPP
650.0000 mg | Freq: Four times a day (QID) | RECTAL | Status: DC | PRN
Start: 2015-12-24 — End: 2015-12-25

## 2015-12-24 MED ORDER — VITAMIN B-1 100 MG PO TABS
100.0000 mg | ORAL_TABLET | Freq: Every day | ORAL | Status: DC
  Administered 2015-12-24 – 2015-12-25 (×2): 100 mg via ORAL
  Filled 2015-12-24: qty 1

## 2015-12-24 MED ORDER — FOLIC ACID 1 MG PO TABS
1.0000 mg | ORAL_TABLET | Freq: Every day | ORAL | Status: DC
  Administered 2015-12-24 – 2015-12-25 (×2): 1 mg via ORAL
  Filled 2015-12-24 (×2): qty 1

## 2015-12-24 MED ORDER — ASPIRIN EC 81 MG PO TBEC
81.0000 mg | DELAYED_RELEASE_TABLET | Freq: Every day | ORAL | Status: DC
  Administered 2015-12-24 – 2015-12-25 (×2): 81 mg via ORAL
  Filled 2015-12-24 (×2): qty 1

## 2015-12-24 MED ORDER — ENSURE ENLIVE PO LIQD
237.0000 mL | Freq: Three times a day (TID) | ORAL | Status: DC
  Administered 2015-12-24 – 2015-12-25 (×5): 237 mL via ORAL

## 2015-12-24 MED ORDER — LORAZEPAM 1 MG PO TABS
1.0000 mg | ORAL_TABLET | Freq: Four times a day (QID) | ORAL | Status: DC | PRN
  Filled 2015-12-24: qty 1

## 2015-12-24 MED ORDER — VITAMIN B-1 100 MG PO TABS
100.0000 mg | ORAL_TABLET | Freq: Every day | ORAL | Status: DC
  Filled 2015-12-24: qty 1

## 2015-12-24 NOTE — Care Management Obs Status (Signed)
MEDICARE OBSERVATION STATUS NOTIFICATION   Patient Details  Name: Larry Castaneda MRN: [id number] Date of Birth: Apr 21, 1945   Medicare Observation Status Notification Given:  Yes    Eber HongGreene, Aragorn Recker R, RN 12/24/2015, 11:26 AM

## 2015-12-24 NOTE — ED Provider Notes (Signed)
[facility] Emergency Department Provider Note  ____________________________________________   I have reviewed the triage vital signs and the nursing notes.   HISTORY  Chief Complaint Dizziness and Leg Swelling    HPI Larry Castaneda is a 70 y.o. male who presents today complaining of swelling in his feet for the last several weeks. He denies any chest pain shows breath nausea or vomiting. Patient is a daily drinker. Does have a history of being on HCTZ but does not know of any history of heart failure. His legs have been bilaterally swelling to the point where his daughter got worried and brought him in.      Past Medical History:  Diagnosis Date  . COPD (chronic obstructive pulmonary disease) (HCC)   . Hypertension   . Seizures [facility])     Patient Active Problem List   Diagnosis Date Noted  . Seizures (HCC) 10/30/2014    History reviewed. No pertinent surgical history.  Prior to Admission medications   Medication Sig Start Date End Date Taking? Authorizing Provider  doxazosin (CARDURA) 2 MG tablet Take 2 mg by mouth at bedtime.   Yes Historical Provider, MD  phenytoin (DILANTIN) 100 MG ER capsule Take 100 mg by mouth 2 (two) times daily.   Yes Historical Provider, MD  levETIRAcetam (KEPPRA) 500 MG tablet Take 1 tablet (500 mg total) by mouth 2 (two) times daily. Patient not taking: Reported on 12/24/2015 10/31/14   Adrian Saran, MD  nicotine (NICODERM CQ - DOSED IN MG/24 HOURS) 14 mg/24hr patch Place 1 patch (14 mg total) onto the skin daily. Patient not taking: Reported on 12/24/2015 10/31/14   Adrian Saran, MD    Allergies Penicillins  Family History  Problem Relation Age of Onset  . Alzheimer's disease Father     Social History Social History  Substance Use Topics  . Smoking status: Current Every Day Smoker    Packs/day: 1.00    Types: Cigarettes  . Smokeless tobacco: Never Used  . Alcohol use 1.2 oz/week    2 Cans of beer per week   Comment: was drinking Gin up until 3 days ago drank 1 beer 8/3    Review of Systems Constitutional: No fever/chills Eyes: No visual changes. ENT: No sore throat. No stiff neck no neck pain Cardiovascular: Denies chest pain. Respiratory: Denies shortness of breath. Gastrointestinal:   no vomiting.  No diarrhea.  No constipation. Genitourinary: Negative for dysuria. Musculoskeletal: Positive lower extremity swelling Skin: Negative for rash. Neurological: Negative for severe headaches, focal weakness or numbness. 10-point ROS otherwise negative.  ____________________________________________   PHYSICAL EXAM:  VITAL SIGNS: ED Triage Vitals [12/23/15 2255]  Enc Vitals Group     BP 103/72     Pulse Rate (!) 106     Resp 18     Temp 97.8 F (36.6 C)     Temp Source Oral     SpO2 95 %     Weight 130 lb (59 kg)     Height 6\' 1"  (1.854 m)     Head Circumference      Peak Flow      Pain Score      Pain Loc      Pain Edu?      Excl. in GC?     Constitutional: Alert and oriented. Well appearing and in no acute distress. Eyes: Conjunctivae are normal. PERRL. EOMI. Head: Atraumatic. Nose: No congestion/rhinnorhea. Mouth/Throat: Mucous membranes are moist.  Oropharynx non-erythematous. Neck: No stridor.  Nontender with no meningismus Cardiovascular: Normal rate, regular rhythm. Grossly normal heart sounds.  Good peripheral circulation. Respiratory: Normal respiratory effort.  No retractions. Lungs CTAB. Abdominal: Soft and nontender. No distention. No guarding no rebound Back:  There is no focal tenderness or step off.  there is no midline tenderness there are no lesions noted. there is no CVA tenderness Musculoskeletal: No lower extremity tenderness, no upper extremity tenderness. No joint effusions, no DVT signs strong distal pulsesThere is mostly edema to bilateral feet with no significant edema to the cast. Neurologic:  Normal speech and language. No gross focal neurologic  deficits are appreciated.  Skin:  Skin is warm, dry and intact. No rash noted. Psychiatric: Mood and affect are normal. Speech and behavior are normal.  ____________________________________________   LABS (all labs ordered are listed, but only abnormal results are displayed)  Labs Reviewed  BASIC METABOLIC PANEL - Abnormal; Notable for the following:       Result Value   Potassium 2.3 (*)    Chloride 94 (*)    Glucose, Bld 112 (*)    BUN <5 (*)    Creatinine, Ser 0.53 (*)    Calcium 8.4 (*)    All other components within normal limits  CBC - Abnormal; Notable for the following:    RBC 4.02 (*)    HCT 38.7 (*)    RDW 15.2 (*)    Platelets 133 (*)    All other components within normal limits  URINALYSIS COMPLETEWITH MICROSCOPIC ([facility] ONLY) - Abnormal; Notable for the following:    Color, Urine YELLOW (*)    APPearance CLEAR (*)    Specific Gravity, Urine 1.002 (*)    Leukocytes, UA 3+ (*)    All other components within normal limits  TROPONIN I - Abnormal; Notable for the following:    Troponin I 0.03 (*)    All other components within normal limits  BRAIN NATRIURETIC PEPTIDE  MAGNESIUM   ____________________________________________  EKG  I personally interpreted any EKGs ordered by me or triage Sinus rhythm rate 96 bpm no acute ST elevation or depression normal axis specific ST changes. ____________________________________________  RADIOLOGY  I reviewed any imaging ordered by me or triage that were performed during my shift and, if possible, patient and/or family made aware of any abnormal findings. ____________________________________________   PROCEDURES  Procedure(s) performed: None  Procedures  Critical Care performed: None  ____________________________________________   INITIAL IMPRESSION / ASSESSMENT AND PLAN / ED COURSE  Pertinent labs & imaging results that were available during my care of the patient were reviewed by me and considered in my  medical decision making (see chart for details).  Patient here with bilateral lower extremity swelling. He used to be on HCTZ no longer is. Normally, we are just diuresis but his potassium was dangerously low at 2.3 and a diuresis attempt would cause him significant harm potentially from hypokalemia. Therefore, we will institute potassium replacement. I'll check a magnesium is the suspect this will likely also below, and patient will be admitted to the hospital for his significant hypokalemia and bilateral lower extremity edema. He is in no acute distress at this time.  Clinical Course   ____________________________________________   FINAL CLINICAL IMPRESSION(S) / ED DIAGNOSES  Final diagnoses:  None      This chart was dictated using voice recognition software.  Despite best efforts to proofread,  errors can occur which can change meaning.      Jeanmarie PlantJames A Mistee Soliman, MD 12/24/15 [phone]

## 2015-12-24 NOTE — Progress Notes (Signed)
[facility] [place] at [facility]   PATIENT NAME: Larry Castaneda    MR#:  [id number]  DATE OF BIRTH:  12/15/1945  SUBJECTIVE:   Pt. Here due to weakness, LE edema and noted to be hypokalemic, Hypomagnesemic.    REVIEW OF SYSTEMS:    Review of Systems  Constitutional: Negative for fever and weight loss.  HENT: Negative for congestion, nosebleeds and tinnitus.   Eyes: Negative for blurred vision, double vision and redness.  Respiratory: Negative for cough, hemoptysis and shortness of breath.   Cardiovascular: Negative for chest pain, orthopnea, leg swelling and PND.  Gastrointestinal: Negative for abdominal pain, diarrhea, melena, nausea and vomiting.  Genitourinary: Negative for dysuria, hematuria and urgency.  Musculoskeletal: Negative for falls and joint pain.  Neurological: Negative for dizziness, tingling, sensory change, focal weakness, seizures, weakness and headaches.  Endo/Heme/Allergies: Negative for polydipsia. Does not bruise/bleed easily.  Psychiatric/Behavioral: Negative for depression and memory loss. The patient is not nervous/anxious.     Nutrition: 2 gm sodium Tolerating Diet: Yes Tolerating PT: Await Eval.   DRUG ALLERGIES:   Allergies  Allergen Reactions  . Penicillins Swelling    VITALS:  Blood pressure 134/86, pulse (!) 108, temperature 98.3 F (36.8 C), temperature source Oral, resp. rate 16, height 6\' 1"  (1.854 m), weight 57.7 kg (127 lb 4.8 oz), SpO2 96 %.  PHYSICAL EXAMINATION:   Physical Exam  GENERAL:  70 y.o.-year-old patient lying in the bed in no acute distress.  EYES: Pupils equal, round, reactive to light and accommodation. No scleral icterus. Extraocular muscles intact.  HEENT: Head atraumatic, normocephalic. Oropharynx and nasopharynx clear.  NECK:  Supple, no jugular venous distention. No thyroid enlargement, no tenderness.  LUNGS: Normal breath sounds bilaterally, no wheezing, rales, rhonchi. No use of accessory  muscles of respiration.  CARDIOVASCULAR: S1, S2 normal. No murmurs, rubs, or gallops.  ABDOMEN: Soft, nontender, nondistended. Bowel sounds present. No organomegaly or mass.  EXTREMITIES: No cyanosis, clubbing or edema b/l.    NEUROLOGIC: Cranial nerves II through XII are intact. No focal Motor or sensory deficits b/l.   PSYCHIATRIC: The patient is alert and oriented x 3.  SKIN: No obvious rash, lesion, or ulcer.    LABORATORY PANEL:   CBC  Recent Labs Lab 12/24/15 0445  WBC 7.7  HGB 13.2  HCT 38.6*  PLT 132*   ------------------------------------------------------------------------------------------------------------------  Chemistries   Recent Labs Lab 12/24/15 0445  NA 139  K 2.8*  CL 96*  CO2 33*  GLUCOSE 94  BUN <5*  CREATININE 0.48*  CALCIUM 8.5*  MG 2.3   ------------------------------------------------------------------------------------------------------------------  Cardiac Enzymes  Recent Labs Lab 12/24/15 1013  TROPONINI 0.04*   ------------------------------------------------------------------------------------------------------------------  RADIOLOGY:  Dg Chest 2 View  Result Date: 12/24/2015 CLINICAL DATA:  Subacute onset of foot swelling and productive cough. Dizziness. Initial encounter. EXAM: CHEST  2 VIEW COMPARISON:  CT of the chest performed 10/30/2014 FINDINGS: The lungs are well-aerated. Mild right basilar atelectasis or scarring is noted. There is no evidence of pleural effusion or pneumothorax. The heart is normal in size; the mediastinal contour is within normal limits. No acute osseous abnormalities are seen. IMPRESSION: Mild right basilar atelectasis or scarring noted. Lungs otherwise clear. Electronically Signed   By: Roanna Raider M.D.   On: 12/24/2015 00:07     ASSESSMENT AND PLAN:   70 year old male with past medical history of alcohol abuse, history of seizures, hypertension, COPD who presented to the hospital due to lower  extremity edema and weakness  and noted to be hypokalemic.  1. Hypokalemia - due to poor PO intake. ETOH abuse.  - cont. To supplement and it's improving and will monitor.   2. Hypomagnesemia - replaced and improved.   3. ETOH abuse - cont. CIWA.  - cont. Thiamine, Folate  4.  Hx of seizures - cont. Dilantin  5. BPH - cont. Doxazosin.      All the records are reviewed and case discussed with Care Management/Social Worker. Management plans discussed with the patient, family and they are in agreement.  CODE STATUS: full  DVT Prophylaxis: Lovenox  TOTAL TIME TAKING CARE OF THIS PATIENT: 25 minutes.   POSSIBLE D/C IN 1-2 DAYS, DEPENDING ON CLINICAL CONDITION.   Houston SirenSAINANI,VIVEK J M.D on 12/24/2015 at 3:17 PM  Between 7am to 6pm - Pager - [phone]  After 6pm go to [email or web address] - Social research officer, governmentpassword [email or web address]  [employer]  Office  [phone]  CC: Primary care physician; Hyman HopesHarriett P Burns, MD

## 2015-12-24 NOTE — Care Management (Signed)
Placed in observation for symptoms of dizziness.  Drinks alcohol daily.  Lives with a friend.  found to have low potassium and magnesium and is receiving replacements.  He is scoring 0 on CIWA scales.   Independent in all adls, denies issues accessing medical care, obtaining medications or with transportation, although he does not drive.  Current with PCP -  Dr Lawerance BachBurns.

## 2015-12-24 NOTE — Progress Notes (Signed)
  Echocardiogram 2D Echocardiogram has been performed.  Larry SavoyCasey N Joselinne Castaneda 12/24/2015, 3:35 PM

## 2015-12-24 NOTE — ED Notes (Signed)
Charge nurse notified of K+

## 2015-12-24 NOTE — Progress Notes (Signed)
Patient admitted without any complications. Patients admits to drinking alcoholic beverages everyday and declined a nicotine patch for smoking cessation.Patient is currently resting. Will continue to monitor and assess.

## 2015-12-24 NOTE — H&P (Signed)
[facility] [place] [facility]   PATIENT NAME: Larry Castaneda    MR#:  [id number]  DATE OF BIRTH:  April 14, 1945  DATE OF ADMISSION:  12/24/2015  PRIMARY CARE PHYSICIAN: Harriett Pietro CassisP Burns, MD   REQUESTING/REFERRING PHYSICIAN:   CHIEF COMPLAINT:   Chief Complaint  Patient presents with  . Dizziness  . Leg Swelling    HISTORY OF PRESENT ILLNESS: Larry Castaneda  is a 70 y.o. male with a known history of COPD, hypertension, seizure disorder, alcohol abuse presented to the emergency room with a lower extremity edema and  has difficulty in balancing. No history of fall or head injury. Patient drinks alcohol on a regular basis. Off late for the last 4 weeks he has noticed some swelling in the lower extremities especially around the ankles. No complaints of any chest pain. No complaints of any shortness of breath orthopnea. No history of any heart failure. Patient was evaluated in the emergency room his potassium was low and troponin was borderline. No history of recent travel or sick contacts at home. Hospitalist service was consulted for further care of the patient.  PAST MEDICAL HISTORY:   Past Medical History:  Diagnosis Date  . COPD (chronic obstructive pulmonary disease) (HCC)   . Hypertension   . Seizures (HCC)     PAST SURGICAL HISTORY: Past Surgical History:  Procedure Laterality Date  . none      SOCIAL HISTORY:  Social History  Substance Use Topics  . Smoking status: Current Every Day Smoker    Packs/day: 1.00    Types: Cigarettes  . Smokeless tobacco: Never Used  . Alcohol use 1.2 oz/week    2 Cans of beer per week     Comment: was drinking Gin up until 3 days ago drank 1 beer 8/3    FAMILY HISTORY:  Family History  Problem Relation Age of Onset  . Alzheimer's disease Father     DRUG ALLERGIES:  Allergies  Allergen Reactions  . Penicillins Swelling    REVIEW OF SYSTEMS:   CONSTITUTIONAL: No fever, has weakness.  EYES: No  blurred or double vision.  EARS, NOSE, AND THROAT: No tinnitus or ear pain.  RESPIRATORY: No cough, shortness of breath, wheezing or hemoptysis.  CARDIOVASCULAR: No chest pain, orthopnea, has edema.  GASTROINTESTINAL: No nausea, vomiting, diarrhea or abdominal pain.  GENITOURINARY: No dysuria, hematuria.  ENDOCRINE: No polyuria, nocturia,  HEMATOLOGY: No anemia, easy bruising or bleeding SKIN: No rash or lesion. MUSCULOSKELETAL: No joint pain or arthritis.   NEUROLOGIC: No tingling, numbness, weakness.  Has dizziness. PSYCHIATRY: No anxiety or depression.   MEDICATIONS AT HOME:  Prior to Admission medications   Medication Sig Start Date End Date Taking? Authorizing Provider  doxazosin (CARDURA) 2 MG tablet Take 2 mg by mouth at bedtime.   Yes Historical Provider, MD  phenytoin (DILANTIN) 100 MG ER capsule Take 100 mg by mouth 2 (two) times daily.   Yes Historical Provider, MD  levETIRAcetam (KEPPRA) 500 MG tablet Take 1 tablet (500 mg total) by mouth 2 (two) times daily. Patient not taking: Reported on 12/24/2015 10/31/14   Adrian SaranSital Mody, MD  nicotine (NICODERM CQ - DOSED IN MG/24 HOURS) 14 mg/24hr patch Place 1 patch (14 mg total) onto the skin daily. Patient not taking: Reported on 12/24/2015 10/31/14   Adrian SaranSital Mody, MD      PHYSICAL EXAMINATION:   VITAL SIGNS: Blood pressure 113/78, pulse 80, temperature 97.8 F (36.6 C), temperature source Oral, resp. rate 18,  height 6\' 1"  (1.854 m), weight 59 kg (130 lb), SpO2 95 %.  GENERAL:  70 y.o.-year-old patient lying in the bed with no acute distress.  EYES: Pupils equal, round, reactive to light and accommodation. No scleral icterus. Extraocular muscles intact.  HEENT: Head atraumatic, normocephalic. Oropharynx and nasopharynx clear.  NECK:  Supple, no jugular venous distention. No thyroid enlargement, no tenderness.  LUNGS: Normal breath sounds bilaterally, no wheezing, rales,rhonchi or crepitation. No use of accessory muscles of respiration.   CARDIOVASCULAR: S1, S2 normal. No murmurs, rubs, or gallops.  ABDOMEN: Soft, nontender, nondistended. Bowel sounds present. No organomegaly or mass.  EXTREMITIES: Has pedal edema, no cyanosis, or clubbing.  NEUROLOGIC: Cranial nerves II through XII are intact. Muscle strength 5/5 in all extremities. Sensation intact. Gait not checked.  PSYCHIATRIC: The patient is alert and oriented x 3.  SKIN: No obvious rash, lesion, or ulcer.   LABORATORY PANEL:   CBC  Recent Labs Lab 12/23/15 2257  WBC 8.6  HGB 13.1  HCT 38.7*  PLT 133*  MCV 96.3  MCH 32.5  MCHC 33.8  RDW 15.2*   ------------------------------------------------------------------------------------------------------------------  Chemistries   Recent Labs Lab 12/23/15 2257  NA 136  K 2.3*  CL 94*  CO2 29  GLUCOSE 112*  BUN <5*  CREATININE 0.53*  CALCIUM 8.4*  MG 1.0*   ------------------------------------------------------------------------------------------------------------------ estimated creatinine clearance is 70.7 mL/min (by C-G formula based on SCr of 0.53 mg/dL (L)). ------------------------------------------------------------------------------------------------------------------ No results for input(s): TSH, T4TOTAL, T3FREE, THYROIDAB in the last 72 hours.  Invalid input(s): FREET3   Coagulation profile No results for input(s): INR, PROTIME in the last 168 hours. ------------------------------------------------------------------------------------------------------------------- No results for input(s): DDIMER in the last 72 hours. -------------------------------------------------------------------------------------------------------------------  Cardiac Enzymes  Recent Labs Lab 12/23/15 2257  TROPONINI 0.03*   ------------------------------------------------------------------------------------------------------------------ Invalid input(s):  POCBNP  ---------------------------------------------------------------------------------------------------------------  Urinalysis    Component Value Date/Time   COLORURINE YELLOW (A) 12/23/2015 2257   APPEARANCEUR CLEAR (A) 12/23/2015 2257   LABSPEC 1.002 (L) 12/23/2015 2257   PHURINE 8.0 12/23/2015 2257   GLUCOSEU NEGATIVE 12/23/2015 2257   HGBUR NEGATIVE 12/23/2015 2257   BILIRUBINUR NEGATIVE 12/23/2015 2257   KETONESUR NEGATIVE 12/23/2015 2257   PROTEINUR NEGATIVE 12/23/2015 2257   NITRITE NEGATIVE 12/23/2015 2257   LEUKOCYTESUR 3+ (A) 12/23/2015 2257     RADIOLOGY: Dg Chest 2 View  Result Date: 12/24/2015 CLINICAL DATA:  Subacute onset of foot swelling and productive cough. Dizziness. Initial encounter. EXAM: CHEST  2 VIEW COMPARISON:  CT of the chest performed 10/30/2014 FINDINGS: The lungs are well-aerated. Mild right basilar atelectasis or scarring is noted. There is no evidence of pleural effusion or pneumothorax. The heart is normal in size; the mediastinal contour is within normal limits. No acute osseous abnormalities are seen. IMPRESSION: Mild right basilar atelectasis or scarring noted. Lungs otherwise clear. Electronically Signed   By: Roanna Raider M.D.   On: 12/24/2015 00:07    EKG: Orders placed or performed during the hospital encounter of 12/24/15  . ED EKG  . ED EKG    IMPRESSION AND PLAN: 70 year old male patient with history of COPD, seizure disorder, alcohol abuse presented to the emergency room with dizziness and lower extremity swelling. Admitting diagnosis 1. Hypokalemia 2. Hypomagnesemia 3. Gait instability 4. Alcohol abuse 5. Edema of the legs rule out cardio myopathy 6. Borderline troponin could be secondary to demand ischemia Treatment plan Admit patient to telemetry observation bed Supplemental oral potassium Supplement IV magnesium Cycle troponin to rule out ischemia Check echocardiogram  to assess LV function Thiamine and folate acid  supplementation Supportive care.  All the records are reviewed and case discussed with ED provider. Management plans discussed with the patient, family and they are in agreement.  CODE STATUS:FULL Code Status History    Date Active Date Inactive Code Status Order ID Comments User Context   10/30/2014  9:50 PM 10/31/2014  6:22 PM Full Code [id number]  Houston Siren, MD Inpatient       TOTAL TIME TAKING CARE OF THIS PATIENT: 50 minutes.    Ihor Austin M.D on 12/24/2015 at 2:38 AM  Between 7am to 6pm - Pager - [phone]  After 6pm go to [email or web address] - password [email or web address] [facility]  [place] [facility]  Office  [phone]  CC: Primary care physician; Hyman Hopes, MD

## 2015-12-24 NOTE — Care Management (Signed)
Larry Castaneda appreciates prayer and wants to get out of hospital as soon as possible.

## 2015-12-24 NOTE — Progress Notes (Signed)
Initial Nutrition Assessment  DOCUMENTATION CODES:   Severe malnutrition in context of chronic illness  INTERVENTION:  -Ensure Enlive po TID, each supplement provides 350 kcal and 20 grams of protein  NUTRITION DIAGNOSIS:   Malnutrition related to chronic illness as evidenced by severe depletion of muscle mass, severe depletion of body fat.  GOAL:   Patient will meet greater than or equal to 90% of their needs  MONITOR:   PO intake, I & O's, Labs, Weight trends, Supplement acceptance  REASON FOR ASSESSMENT:   Malnutrition Screening Tool    ASSESSMENT:   Larry Castaneda  is a 70 y.o. male with a known history of COPD, hypertension, seizure disorder, alcohol abuse presented to the emergency room with a lower extremity edema and  has difficulty in balancing. No history of fall or head injury. Patient drinks alcohol on a regular basis.  Spoke with Larry Castaneda at bedside. He endorses severe wt loss over the past month, normal weight of 160# and poor appetite. However, he continues to consume alcohol regularly at home. PO intake during stay documented at 100% Denies issues chewing/swallowing/choking No nasuea/vomiting at this time Nutrition-Focused physical exam completed. Findings are severe fat depletion, severe muscle depletion, and moderate edema.  Patient endorses consuming ensure regularly at home, will provide during stay. Labs and medications reviewed: K 2.8, Folic Acid, MVI w/ Minerals, Thiamine  Diet Order:  Diet 2 gram sodium Room service appropriate? Yes; Fluid consistency: Thin  Skin:  Reviewed, no issues  Last BM:  9/27  Height:   Ht Readings from Last 1 Encounters:  12/24/15 6\' 1"  (1.854 m)    Weight:   Wt Readings from Last 1 Encounters:  12/24/15 127 lb 4.8 oz (57.7 kg)    Ideal Body Weight:  83.63 kg  BMI:  Body mass index is 16.8 kg/m.  Estimated Nutritional Needs:   Kcal:  1440-1730 calories  Protein:  58=70 gm  Fluid:  >/=  1.4L  EDUCATION NEEDS:   No education needs identified at this time  Dionne AnoWilliam M. Daiki Dicostanzo, MS, RD LDN Inpatient Clinical Dietitian Pager [phone]

## 2015-12-24 NOTE — Progress Notes (Signed)
Notified Dr.Pyreddy about ETOH abuse and CIWA order. Orders will be placed by physician.

## 2015-12-25 LAB — BASIC METABOLIC PANEL WITH GFR
Anion gap: 5 (ref 5–15)
BUN: <5 mg/dL — ABNORMAL LOW (ref 6–20)
CO2: 31 mmol/L (ref 22–32)
Calcium: 8.8 mg/dL — ABNORMAL LOW (ref 8.9–10.3)
Chloride: 102 mmol/L (ref 101–111)
Creatinine, Ser: 0.46 mg/dL — ABNORMAL LOW (ref 0.61–1.24)
GFR calc Af Amer: >60 mL/min
GFR calc non Af Amer: >60 mL/min
Glucose, Bld: 94 mg/dL (ref 65–99)
Potassium: 3.2 mmol/L — ABNORMAL LOW (ref 3.5–5.1)
Sodium: 138 mmol/L (ref 135–145)

## 2015-12-25 MED ORDER — METOPROLOL TARTRATE 25 MG PO TABS: 12.5000 mg | ORAL_TABLET | Freq: Two times a day (BID) | ORAL | 1 refills | Status: AC

## 2015-12-25 MED ORDER — POTASSIUM CHLORIDE ER 20 MEQ PO TBCR: 10.0000 meq | EXTENDED_RELEASE_TABLET | Freq: Two times a day (BID) | ORAL | 0 refills | Status: AC

## 2015-12-25 MED ORDER — DILTIAZEM HCL 25 MG/5ML IV SOLN
10.0000 mg | Freq: Once | INTRAVENOUS | Status: AC
  Administered 2015-12-25: 10 mg via INTRAVENOUS
  Filled 2015-12-25: qty 5

## 2015-12-25 NOTE — Progress Notes (Signed)
Pt has orders to be discharged. Discharge instructions given and pt has no additional questions at this time. Medication regimen reviewed and pt educated. Pt verbalized understanding and has no additional questions. Telemetry box removed. IV removed and site in good condition. Pt stable and waiting for transportation.   Suzanne Garbers RN 

## 2015-12-25 NOTE — Discharge Summary (Signed)
[employer] [facility] [place] [facility]   PATIENT NAME: Larry Castaneda    MR#:  [id number]  DATE OF BIRTH:  June 09, 1945  DATE OF ADMISSION:  12/24/2015 ADMITTING PHYSICIAN: Ihor Austin, MD  DATE OF DISCHARGE: 12/25/2015  1:35 PM  PRIMARY CARE PHYSICIAN: Harriett Pietro Cassis, MD    ADMISSION DIAGNOSIS:  Hypokalemia [E87.6] Leg swelling [M79.89]  DISCHARGE DIAGNOSIS:  Principal Problem:   Dizziness Active Problems:   Leg edema   Hypokalemia   SECONDARY DIAGNOSIS:   Past Medical History:  Diagnosis Date  . COPD (chronic obstructive pulmonary disease) (HCC)   . Hypertension   . Seizures [facility])     HOSPITAL COURSE:   70 year old male with past medical history of COPD, history of seizures, alcohol abuse, hypertension who presented to the hospital due to lower extremity edema and noted to be hypokalemic.  1. Hypokalemia-this was secondary to alcohol abuse and poor by mouth intake. -This has since then been supplemented and improved and patient is not being discharged on oral potassium supplements.  2. Hypomagnesemia this is also been replaced and has improved.  3. 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 He responded well to some oral IV Cardizem. -I discharge him on some low-dose beta blocker which help him with his hypertension.  4. History of alcohol abuse-while in the hospital patient was maintained on CIWA protocol and also maintained on thiamine folate. Clinically he shows no signs of alcohol withdrawal presently.  5. History of seizures-patient had no acute seizures while in the hospital and he would resume his Dilantin.  6. History of BPH-patient will continue his doxazosin.  DISCHARGE CONDITIONS:   Stable  CONSULTS OBTAINED:    DRUG ALLERGIES:   Allergies  Allergen Reactions  . Penicillins Swelling    DISCHARGE MEDICATIONS:     Medication List    STOP taking these medications    hydrochlorothiazide 25 MG tablet Commonly known as:  HYDRODIURIL   levETIRAcetam 500 MG tablet Commonly known as:  KEPPRA   nicotine 14 mg/24hr patch Commonly known as:  NICODERM CQ - dosed in mg/24 hours     TAKE these medications   doxazosin 2 MG tablet Commonly known as:  CARDURA Take 2 mg by mouth at bedtime.   metoprolol tartrate 25 MG tablet Commonly known as:  LOPRESSOR Take 0.5 tablets (12.5 mg total) by mouth 2 (two) times daily.   phenytoin 100 MG ER capsule Commonly known as:  DILANTIN Take 100 mg by mouth 2 (two) times daily.   Potassium Chloride ER 20 MEQ Tbcr Take 10 mEq by mouth 2 (two) times daily.         DISCHARGE INSTRUCTIONS:   DIET:  Cardiac diet  DISCHARGE CONDITION:  Stable  ACTIVITY:  Activity as tolerated  OXYGEN:  Home Oxygen: No.   Oxygen Delivery: room air  DISCHARGE LOCATION:  home   If you experience worsening of your admission symptoms, develop shortness of breath, life threatening emergency, suicidal or homicidal thoughts you must seek medical attention immediately by calling 911 or calling your MD immediately  if symptoms less severe.  You Must read complete instructions/literature along with all the possible adverse reactions/side effects for all the Medicines you take and that have been prescribed to you. Take any new Medicines after you have completely understood and accpet all the possible adverse reactions/side effects.   Please note  You were cared for by a hospitalist during your hospital stay. If you have  any questions about your discharge medications or the care you received while you were in the hospital after you are discharged, you can call the unit and asked to speak with the hospitalist on call if the hospitalist that took care of you is not available. Once you are discharged, your primary care physician will handle any further medical issues. Please note that NO REFILLS for any discharge medications will be  authorized once you are discharged, as it is imperative that you return to your primary care physician (or establish a relationship with a primary care physician if you do not have one) for your aftercare needs so that they can reassess your need for medications and monitor your lab values.     Today   No acute complaints presently.  WAnts to go home.  No LE edema noted.  Hypokalemia/Hypomagnesemia improved.   VITAL SIGNS:  Blood pressure (!) 130/92, pulse 90, temperature 97.8 F (36.6 C), temperature source Oral, resp. rate 16, height 6\' 1"  (1.854 m), weight 57.7 kg (127 lb 4.8 oz), SpO2 100 %.  I/O:   Intake/Output Summary (Last 24 hours) at 12/25/15 1536 Last data filed at 12/25/15 1352  Gross per 24 hour  Intake              363 ml  Output              650 ml  Net             -287 ml    PHYSICAL EXAMINATION:  GENERAL:  70 y.o.-year-old patient lying in the bed with no acute distress.  EYES: Pupils equal, round, reactive to light and accommodation. No scleral icterus. Extraocular muscles intact.  HEENT: Head atraumatic, normocephalic. Oropharynx and nasopharynx clear.  NECK:  Supple, no jugular venous distention. No thyroid enlargement, no tenderness.  LUNGS: Normal breath sounds bilaterally, no wheezing, rales,rhonchi. No use of accessory muscles of respiration.  CARDIOVASCULAR: S1, S2 normal. No murmurs, rubs, or gallops.  ABDOMEN: Soft, non-tender, non-distended. Bowel sounds present. No organomegaly or mass.  EXTREMITIES: No pedal edema, cyanosis, or clubbing.  NEUROLOGIC: Cranial nerves II through XII are intact. No focal motor or sensory defecits b/l.  PSYCHIATRIC: The patient is alert and oriented x 3. Good affect.  SKIN: No obvious rash, lesion, or ulcer.   DATA REVIEW:   CBC  Recent Labs Lab 12/24/15 0445  WBC 7.7  HGB 13.2  HCT 38.6*  PLT 132*    Chemistries   Recent Labs Lab 12/24/15 0445 12/25/15 0347  NA 139 138  K 2.8* 3.2*  CL 96* 102  CO2  33* 31  GLUCOSE 94 94  BUN <5* <5*  CREATININE 0.48* 0.46*  CALCIUM 8.5* 8.8*  MG 2.3  --     Cardiac Enzymes  Recent Labs Lab 12/24/15 1619  TROPONINI 0.04*    Microbiology Results  No results found for this or any previous visit.  RADIOLOGY:  Dg Chest 2 View  Result Date: 12/24/2015 CLINICAL DATA:  Subacute onset of foot swelling and productive cough. Dizziness. Initial encounter. EXAM: CHEST  2 VIEW COMPARISON:  CT of the chest performed 10/30/2014 FINDINGS: The lungs are well-aerated. Mild right basilar atelectasis or scarring is noted. There is no evidence of pleural effusion or pneumothorax. The heart is normal in size; the mediastinal contour is within normal limits. No acute osseous abnormalities are seen. IMPRESSION: Mild right basilar atelectasis or scarring noted. Lungs otherwise clear. Electronically Signed   By: Beryle BeamsJeffery  Chang M.D.  On: 12/24/2015 00:07      Management plans discussed with the patient, family and they are in agreement.  CODE STATUS:     Code Status Orders        Start     Ordered   12/24/15 0421  Full code  Continuous     12/24/15 0420    Code Status History    Date Active Date Inactive Code Status Order ID Comments User Context   12/24/2015  4:20 AM 12/24/2015  7:44 AM Full Code [id number]  Ihor Austin, MD ED   10/30/2014  9:50 PM 10/31/2014  6:22 PM Full Code [id number]  Houston Siren, MD Inpatient    Advance Directive Documentation   Flowsheet Row Most Recent Value  Type of Advance Directive  Living will  Pre-existing out of facility DNR order (yellow form or pink MOST form)  No data  "MOST" Form in Place?  No data      TOTAL TIME TAKING CARE OF THIS PATIENT: 40 minutes.    Houston Siren M.D on 12/25/2015 at 3:36 PM  Between 7am to 6pm - Pager - [phone]  After 6pm go to [email or web address] - Social research officer, government  [employer] [facility]  Office  [phone]  CC: Primary care physician; Hyman Hopes,  MD

## 2016-01-09 ENCOUNTER — Encounter: Payer: Self-pay | Admitting: Emergency Medicine

## 2016-01-09 ENCOUNTER — Emergency Department
Admission: EM | Admit: 2016-01-09 | Discharge: 2016-01-09 | Disposition: A | Discharge status: Home / Self Care | Payer: Medicare Other | Attending: Emergency Medicine | Admitting: Emergency Medicine

## 2016-01-09 DIAGNOSIS — R2243 Localized swelling, mass and lump, lower limb, bilateral: Secondary | ICD-10-CM | POA: Diagnosis present

## 2016-01-09 DIAGNOSIS — I10 Essential (primary) hypertension: Secondary | ICD-10-CM | POA: Diagnosis not present

## 2016-01-09 DIAGNOSIS — E876 Hypokalemia: Secondary | ICD-10-CM | POA: Insufficient documentation

## 2016-01-09 DIAGNOSIS — F17210 Nicotine dependence, cigarettes, uncomplicated: Secondary | ICD-10-CM | POA: Diagnosis not present

## 2016-01-09 DIAGNOSIS — M7989 Other specified soft tissue disorders: Secondary | ICD-10-CM

## 2016-01-09 DIAGNOSIS — J449 Chronic obstructive pulmonary disease, unspecified: Secondary | ICD-10-CM | POA: Insufficient documentation

## 2016-01-09 LAB — COMPREHENSIVE METABOLIC PANEL
ALK PHOS: 115 U/L (ref 38–126)
ALT: 20 U/L (ref 17–63)
AST: 48 U/L — ABNORMAL HIGH (ref 15–41)
Albumin: 2.4 g/dL — ABNORMAL LOW (ref 3.5–5.0)
Anion gap: 11 (ref 5–15)
BILIRUBIN TOTAL: 0.3 mg/dL (ref 0.3–1.2)
CHLORIDE: 105 mmol/L (ref 101–111)
CO2: 21 mmol/L — AB (ref 22–32)
CREATININE: 0.44 mg/dL — AB (ref 0.61–1.24)
Calcium: 8.5 mg/dL — ABNORMAL LOW (ref 8.9–10.3)
GFR calc Af Amer: >60 mL/min (ref >60)
GFR calc non Af Amer: >60 mL/min (ref >60)
GLUCOSE: 115 mg/dL — AB (ref 65–99)
Potassium: 3.2 mmol/L — ABNORMAL LOW (ref 3.5–5.1)
Sodium: 137 mmol/L (ref 135–145)
Total Protein: 7.2 g/dL (ref 6.5–8.1)

## 2016-01-09 LAB — CBC WITH DIFFERENTIAL/PLATELET
BASOS PCT: 1 %
Basophils Absolute: 0.1 10*3/uL (ref 0–0.1)
Eosinophils Absolute: 0.2 10*3/uL (ref 0–0.7)
Eosinophils Relative: 2 %
HEMATOCRIT: 34.3 % — AB (ref 40.0–52.0)
Hemoglobin: 12.1 g/dL — ABNORMAL LOW (ref 13.0–18.0)
LYMPHS ABS: 3.3 10*3/uL (ref 1.0–3.6)
Lymphocytes Relative: 26 %
MCH: 33.6 pg (ref 26.0–34.0)
MCHC: 35.3 g/dL (ref 32.0–36.0)
MCV: 95.2 fL (ref 80.0–100.0)
MONO ABS: 1.2 10*3/uL — AB (ref 0.2–1.0)
MONOS PCT: 9 %
NEUTROS ABS: 8.1 10*3/uL — AB (ref 1.4–6.5)
Neutrophils Relative %: 62 %
Platelets: 389 10*3/uL (ref 150–440)
RBC: 3.6 MIL/uL — ABNORMAL LOW (ref 4.40–5.90)
RDW: 15.3 % — AB (ref 11.5–14.5)
WBC: 12.9 10*3/uL — ABNORMAL HIGH (ref 3.8–10.6)

## 2016-01-09 LAB — BRAIN NATRIURETIC PEPTIDE: B Natriuretic Peptide: 46 pg/mL (ref 0.0–100.0)

## 2016-01-09 LAB — TROPONIN I: TROPONIN I: 0.03 ng/mL — AB (ref <0.03)

## 2016-01-09 MED ORDER — POTASSIUM CHLORIDE CRYS ER 20 MEQ PO TBCR
40.0000 meq | EXTENDED_RELEASE_TABLET | Freq: Once | ORAL | Status: AC
  Administered 2016-01-09: 40 meq via ORAL
  Filled 2016-01-09: qty 2

## 2016-01-09 NOTE — ED Provider Notes (Signed)
[facility] Emergency Department Provider Note  ____________________________________________  Time seen: Approximately 4:06 PM  I have reviewed the triage vital signs and the nursing notes.   HISTORY  Chief Complaint Foot Swelling    HPI Larry Castaneda is a 70 y.o. male with a history of bilateral foot swelling presenting for bilateral foot swelling.The patient reports that for the past several weeks he has had bilateral foot swelling. It is worse in the evenings and in the morning. He does not have any associated calf pain, chest pain, or shortness of breath. The patient was seen here 9/28 for the same and was admitted when he was found to be profoundly hypokalemic. No trauma, fever, erythema or warmth. The patient has not followed up with his primary care physician for the symptoms. He denies being on HCTZ or any other diuretic. He does take daily potassium supplementation for his hypokalemia.   Past Medical History:  Diagnosis Date  . COPD (chronic obstructive pulmonary disease) (HCC)   . Hypertension   . Seizures [facility])     Patient Active Problem List   Diagnosis Date Noted  . Dizziness 12/24/2015  . Leg edema 12/24/2015  . Hypokalemia 12/24/2015  . Seizures (HCC) 10/30/2014    Past Surgical History:  Procedure Laterality Date  . none      Current Outpatient Rx  . Order #: [id number] Class: Historical Med  . Order #: [id number] Class: Print  . Order #: [id number] Class: Historical Med  . Order #: [id number] Class: Print    Allergies Penicillins  Family History  Problem Relation Age of Onset  . Alzheimer's disease Father     Social History Social History  Substance Use Topics  . Smoking status: Current Every Day Smoker    Packs/day: 1.00    Types: Cigarettes  . Smokeless tobacco: Never Used  . Alcohol use 1.2 oz/week    2 Cans of beer per week     Comment: was drinking Gin up until 3 days ago drank 1 beer 8/3    Review of  Systems Constitutional: No fever/chills.No lightheadedness or syncope. Eyes: No visual changes. ENT: No sore throat. No congestion or rhinorrhea. Cardiovascular: Denies chest pain. Denies palpitations. Respiratory: Denies shortness of breath.  No cough. Gastrointestinal: No abdominal pain.  No nausea, no vomiting.  No diarrhea.  No constipation. Genitourinary: Negative for dysuria. Musculoskeletal: Negative for back pain. Positive bilateral lower extremity swelling. No calf pain. Skin: Negative for rash. Neurological: Negative for headaches. No focal numbness, tingling or weakness.   10-point ROS otherwise negative.  ____________________________________________   PHYSICAL EXAM:  VITAL SIGNS: ED Triage Vitals  Enc Vitals Group     BP 01/09/16 1333 109/70     Pulse Rate 01/09/16 1333 (!) 109     Resp 01/09/16 1333 18     Temp 01/09/16 1333 97.9 F (36.6 C)     Temp Source 01/09/16 1333 Oral     SpO2 01/09/16 1333 99 %     Weight 01/09/16 1333 140 lb (63.5 kg)     Height 01/09/16 1333 6\' 1"  (1.854 m)     Head Circumference --      Peak Flow --      Pain Score 01/09/16 1338 0     Pain Loc --      Pain Edu? --      Excl. in GC? --     Constitutional: Alert and oriented. Chronically ill appearing but in no acute distress. Answers questions appropriately. Eyes: Conjunctivae  are normal.  EOMI. No scleral icterus. Head: Atraumatic. Nose: No congestion/rhinnorhea. Mouth/Throat: Mucous membranes are moist.  Neck: No stridor.  Supple.  No JVD. Cardiovascular: Normal rate, regular rhythm. No murmurs, rubs or gallops.  Respiratory: Normal respiratory effort.  No accessory muscle use or retractions. Lungs CTAB.  No wheezes, rales or ronchi. Gastrointestinal: Soft, nontender and nondistended.  No guarding or rebound.  No peritoneal signs. Musculoskeletal: Positive bilateral foot and distal ankle swelling that is nonpitting. Normal DP and PT pulses bilaterally. Normal range of motion  of the bilateral ankles and toes. Normal sensation to light touch throughout the bilateral feet and tibia.. No ttp in the calves or palpable cords.  Negative Homan's sign. Neurologic:  A&Ox3.  Speech is clear.  Face and smile are symmetric.  EOMI.  Moves all extremities well. Skin:  Skin is warm, dry and intact. No rash noted. Psychiatric: Mood and affect are normal. Speech and behavior are normal.  Normal judgement  ____________________________________________   LABS (all labs ordered are listed, but only abnormal results are displayed)  Labs Reviewed  CBC WITH DIFFERENTIAL/PLATELET - Abnormal; Notable for the following:       Result Value   WBC 12.9 (*)    RBC 3.60 (*)    Hemoglobin 12.1 (*)    HCT 34.3 (*)    RDW 15.3 (*)    Neutro Abs 8.1 (*)    Monocytes Absolute 1.2 (*)    All other components within normal limits  COMPREHENSIVE METABOLIC PANEL - Abnormal; Notable for the following:    Potassium 3.2 (*)    CO2 21 (*)    Glucose, Bld 115 (*)    BUN <5 (*)    Creatinine, Ser 0.44 (*)    Calcium 8.5 (*)    Albumin 2.4 (*)    AST 48 (*)    All other components within normal limits  TROPONIN I - Abnormal; Notable for the following:    Troponin I 0.03 (*)    All other components within normal limits  BRAIN NATRIURETIC PEPTIDE   ____________________________________________  EKG  Not indicated ____________________________________________  RADIOLOGY  No results found.  ____________________________________________   PROCEDURES  Procedure(s) performed: No  Procedures  Critical Care performed: No ____________________________________________   INITIAL IMPRESSION / ASSESSMENT AND PLAN / ED COURSE  Pertinent labs & imaging results that were available during my care of the patient were reviewed by me and considered in my medical decision making (see chart for details).  70 y.o. male with chronic bilateral lower extremity swelling presenting for bilateral  extremity swelling that is unchanged over the last several weeks. This patient is not on a diuretic, but has a recent history of hypokalemia and today has a potassium of 3.2. We will initiate more conservative mass measures including compression stockings, and elevation above the heart as much as possible. I talked to the patient and the family about these measures, and encouraged him to make a follow-up appointment with her primary care physician in 2-3 days. I will supplement his potassium today, and he will have it rechecked by his regular doctor as well. I do not see any evidence that would be suggestive of bilateral DVT, so ultrasound imaging is not indicated today. His creatinine and BNP are not suggestive of renal insufficiency or congestive heart failure as a cause of his swelling. Plan discharge.  Return precautions as well as follow-up instructions were discussed.  ____________________________________________  FINAL CLINICAL IMPRESSION(S) / ED DIAGNOSES  Final diagnoses:  Foot swelling  Hypokalemia    Clinical Course      NEW MEDICATIONS STARTED DURING THIS VISIT:  New Prescriptions   No medications on file      Rockne Menghini, MD 01/09/16 1611

## 2016-01-09 NOTE — ED Triage Notes (Signed)
Patient to ER for c/o feet swelling bilaterally x3 weeks. Patient denies any history of the same.

## 2016-01-09 NOTE — Discharge Instructions (Signed)
Please purchase compression stockings at your local pharmacy, and wear them every day during the day. When you're seated, elevate your feet above the level of your heart to decrease swelling.  Please make an appointment with your primary care physician for reevaluation of your foot swelling, as well as a recheck of your potassium.  Return to the emergency department if you develop increased swelling, shortness of breath, chest pain, calf pain, fever, palpitations or lightheadedness, or any other symptoms concerning to you.

## 2016-06-14 ENCOUNTER — Other Ambulatory Visit: Payer: Self-pay | Admitting: Internal Medicine

## 2016-06-14 DIAGNOSIS — R911 Solitary pulmonary nodule: Secondary | ICD-10-CM

## 2016-06-17 ENCOUNTER — Ambulatory Visit
Admission: RE | Admit: 2016-06-17 | Discharge: 2016-06-17 | Disposition: A | Discharge status: Home / Self Care | Payer: Medicare Other | Source: Ambulatory Visit | Attending: Internal Medicine | Admitting: Internal Medicine

## 2016-07-01 ENCOUNTER — Emergency Department: Payer: Medicare Other

## 2016-07-01 ENCOUNTER — Inpatient Hospital Stay (HOSPITAL_COMMUNITY)
Admit: 2016-07-01 | Discharge: 2016-07-01 | Disposition: A | Discharge status: Home / Self Care | Payer: Medicare Other | Attending: Critical Care Medicine | Admitting: Critical Care Medicine

## 2016-07-01 ENCOUNTER — Inpatient Hospital Stay: Payer: Medicare Other

## 2016-07-01 ENCOUNTER — Inpatient Hospital Stay
Admission: EM | Admit: 2016-07-01 | Discharge: 2016-07-26 | DRG: 871 | Disposition: E | Payer: Medicare Other | Attending: Internal Medicine | Admitting: Internal Medicine

## 2016-07-01 ENCOUNTER — Encounter: Payer: Self-pay | Admitting: Emergency Medicine

## 2016-07-01 ENCOUNTER — Inpatient Hospital Stay (HOSPITAL_COMMUNITY): Payer: Medicare Other

## 2016-07-01 DIAGNOSIS — J439 Emphysema, unspecified: Secondary | ICD-10-CM | POA: Diagnosis present

## 2016-07-01 DIAGNOSIS — F1020 Alcohol dependence, uncomplicated: Secondary | ICD-10-CM | POA: Diagnosis present

## 2016-07-01 DIAGNOSIS — R6521 Severe sepsis with septic shock: Secondary | ICD-10-CM | POA: Diagnosis present

## 2016-07-01 DIAGNOSIS — D649 Anemia, unspecified: Secondary | ICD-10-CM | POA: Diagnosis not present

## 2016-07-01 DIAGNOSIS — J9690 Respiratory failure, unspecified, unspecified whether with hypoxia or hypercapnia: Secondary | ICD-10-CM

## 2016-07-01 DIAGNOSIS — I493 Ventricular premature depolarization: Secondary | ICD-10-CM | POA: Diagnosis present

## 2016-07-01 DIAGNOSIS — R9401 Abnormal electroencephalogram [EEG]: Secondary | ICD-10-CM | POA: Diagnosis present

## 2016-07-01 DIAGNOSIS — K7290 Hepatic failure, unspecified without coma: Secondary | ICD-10-CM | POA: Diagnosis present

## 2016-07-01 DIAGNOSIS — Z66 Do not resuscitate: Secondary | ICD-10-CM | POA: Diagnosis not present

## 2016-07-01 DIAGNOSIS — D684 Acquired coagulation factor deficiency: Secondary | ICD-10-CM | POA: Diagnosis present

## 2016-07-01 DIAGNOSIS — K7030 Alcoholic cirrhosis of liver without ascites: Secondary | ICD-10-CM | POA: Diagnosis not present

## 2016-07-01 DIAGNOSIS — F17210 Nicotine dependence, cigarettes, uncomplicated: Secondary | ICD-10-CM | POA: Diagnosis present

## 2016-07-01 DIAGNOSIS — R296 Repeated falls: Secondary | ICD-10-CM | POA: Diagnosis present

## 2016-07-01 DIAGNOSIS — J80 Acute respiratory distress syndrome: Secondary | ICD-10-CM | POA: Diagnosis not present

## 2016-07-01 DIAGNOSIS — J9601 Acute respiratory failure with hypoxia: Secondary | ICD-10-CM

## 2016-07-01 DIAGNOSIS — G9341 Metabolic encephalopathy: Secondary | ICD-10-CM | POA: Diagnosis present

## 2016-07-01 DIAGNOSIS — G40909 Epilepsy, unspecified, not intractable, without status epilepticus: Secondary | ICD-10-CM | POA: Diagnosis present

## 2016-07-01 DIAGNOSIS — Z820 Family history of epilepsy and other diseases of the nervous system: Secondary | ICD-10-CM

## 2016-07-01 DIAGNOSIS — Z880 Allergy status to penicillin: Secondary | ICD-10-CM

## 2016-07-01 DIAGNOSIS — D696 Thrombocytopenia, unspecified: Secondary | ICD-10-CM | POA: Diagnosis present

## 2016-07-01 DIAGNOSIS — Z79899 Other long term (current) drug therapy: Secondary | ICD-10-CM

## 2016-07-01 DIAGNOSIS — I10 Essential (primary) hypertension: Secondary | ICD-10-CM | POA: Diagnosis present

## 2016-07-01 DIAGNOSIS — E872 Acidosis: Secondary | ICD-10-CM | POA: Diagnosis present

## 2016-07-01 DIAGNOSIS — F1010 Alcohol abuse, uncomplicated: Secondary | ICD-10-CM

## 2016-07-01 DIAGNOSIS — N179 Acute kidney failure, unspecified: Secondary | ICD-10-CM | POA: Diagnosis present

## 2016-07-01 DIAGNOSIS — K760 Fatty (change of) liver, not elsewhere classified: Secondary | ICD-10-CM | POA: Diagnosis present

## 2016-07-01 DIAGNOSIS — R7401 Elevation of levels of liver transaminase levels: Secondary | ICD-10-CM

## 2016-07-01 DIAGNOSIS — A419 Sepsis, unspecified organism: Principal | ICD-10-CM | POA: Diagnosis present

## 2016-07-01 DIAGNOSIS — J9621 Acute and chronic respiratory failure with hypoxia: Secondary | ICD-10-CM | POA: Diagnosis present

## 2016-07-01 DIAGNOSIS — L89153 Pressure ulcer of sacral region, stage 3: Secondary | ICD-10-CM | POA: Diagnosis present

## 2016-07-01 DIAGNOSIS — Z515 Encounter for palliative care: Secondary | ICD-10-CM | POA: Diagnosis present

## 2016-07-01 DIAGNOSIS — R911 Solitary pulmonary nodule: Secondary | ICD-10-CM | POA: Diagnosis present

## 2016-07-01 DIAGNOSIS — I248 Other forms of acute ischemic heart disease: Secondary | ICD-10-CM | POA: Diagnosis present

## 2016-07-01 DIAGNOSIS — E162 Hypoglycemia, unspecified: Secondary | ICD-10-CM | POA: Diagnosis present

## 2016-07-01 DIAGNOSIS — J189 Pneumonia, unspecified organism: Secondary | ICD-10-CM | POA: Diagnosis present

## 2016-07-01 DIAGNOSIS — Y900 Blood alcohol level of less than 20 mg/100 ml: Secondary | ICD-10-CM | POA: Diagnosis present

## 2016-07-01 DIAGNOSIS — R740 Nonspecific elevation of levels of transaminase and lactic acid dehydrogenase [LDH]: Secondary | ICD-10-CM

## 2016-07-01 DIAGNOSIS — E876 Hypokalemia: Secondary | ICD-10-CM | POA: Diagnosis present

## 2016-07-01 DIAGNOSIS — K279 Peptic ulcer, site unspecified, unspecified as acute or chronic, without hemorrhage or perforation: Secondary | ICD-10-CM | POA: Diagnosis present

## 2016-07-01 DIAGNOSIS — J9600 Acute respiratory failure, unspecified whether with hypoxia or hypercapnia: Secondary | ICD-10-CM | POA: Diagnosis present

## 2016-07-01 LAB — CBC
HEMATOCRIT: 26.2 % — AB (ref 40.0–52.0)
HEMOGLOBIN: 8.2 g/dL — AB (ref 13.0–18.0)
MCH: 34.7 pg — ABNORMAL HIGH (ref 26.0–34.0)
MCHC: 31.3 g/dL — AB (ref 32.0–36.0)
MCV: 111.1 fL — ABNORMAL HIGH (ref 80.0–100.0)
Platelets: 137 10*3/uL — ABNORMAL LOW (ref 150–440)
RBC: 2.36 MIL/uL — AB (ref 4.40–5.90)
RDW: 18.9 % — ABNORMAL HIGH (ref 11.5–14.5)
WBC: 10 10*3/uL (ref 3.8–10.6)

## 2016-07-01 LAB — BLOOD GAS, ARTERIAL
Acid-base deficit: 10.7 mmol/L — ABNORMAL HIGH (ref 0.0–2.0)
BICARBONATE: 19.3 mmol/L — AB (ref 20.0–28.0)
FIO2: 1
MECHANICAL RATE: 14
O2 Saturation: 68.6 %
PEEP: 15 cmH2O
Patient temperature: 37
VT: 450 mL
pCO2 arterial: 68 mmHg (ref 32.0–48.0)
pH, Arterial: 7.06 — CL (ref 7.350–7.450)
pO2, Arterial: 52 mmHg — ABNORMAL LOW (ref 83.0–108.0)

## 2016-07-01 LAB — CBC WITH DIFFERENTIAL/PLATELET
BASOS ABS: 0 10*3/uL (ref 0–0.1)
Basophils Relative: 0 %
Eosinophils Absolute: 0.1 10*3/uL (ref 0–0.7)
Eosinophils Relative: 1 %
HCT: 29.3 % — ABNORMAL LOW (ref 40.0–52.0)
HEMOGLOBIN: 9.5 g/dL — AB (ref 13.0–18.0)
LYMPHS ABS: 0.4 10*3/uL — AB (ref 1.0–3.6)
LYMPHS PCT: 3 %
MCH: 34.9 pg — AB (ref 26.0–34.0)
MCHC: 32.3 g/dL (ref 32.0–36.0)
MCV: 108.3 fL — ABNORMAL HIGH (ref 80.0–100.0)
MONO ABS: 1 10*3/uL (ref 0.2–1.0)
Monocytes Relative: 8 %
NEUTROS PCT: 88 %
NRBC: 6 /100{WBCs} — AB
Neutro Abs: 10.7 10*3/uL — ABNORMAL HIGH (ref 1.4–6.5)
PLATELETS: 141 10*3/uL — AB (ref 150–440)
RBC: 2.71 MIL/uL — AB (ref 4.40–5.90)
RDW: 18.9 % — ABNORMAL HIGH (ref 11.5–14.5)
WBC: 12.2 10*3/uL — AB (ref 3.8–10.6)

## 2016-07-01 LAB — BASIC METABOLIC PANEL
ANION GAP: 24 — AB (ref 5–15)
BUN: 13 mg/dL (ref 6–20)
CALCIUM: 7.3 mg/dL — AB (ref 8.9–10.3)
CO2: 19 mmol/L — AB (ref 22–32)
Chloride: 106 mmol/L (ref 101–111)
Creatinine, Ser: 2.41 mg/dL — ABNORMAL HIGH (ref 0.61–1.24)
GFR, EST AFRICAN AMERICAN: 29 mL/min — AB (ref >60)
GFR, EST NON AFRICAN AMERICAN: 25 mL/min — AB (ref >60)
Glucose, Bld: 146 mg/dL — ABNORMAL HIGH (ref 65–99)
Potassium: 3.6 mmol/L (ref 3.5–5.1)
Sodium: 149 mmol/L — ABNORMAL HIGH (ref 135–145)

## 2016-07-01 LAB — URINE DRUG SCREEN, QUALITATIVE (ARMC ONLY)
Amphetamines, Ur Screen: NOT DETECTED
BARBITURATES, UR SCREEN: NOT DETECTED
Benzodiazepine, Ur Scrn: POSITIVE — AB
CANNABINOID 50 NG, UR ~~LOC~~: NOT DETECTED
Cocaine Metabolite,Ur ~~LOC~~: NOT DETECTED
MDMA (ECSTASY) UR SCREEN: NOT DETECTED
Methadone Scn, Ur: NOT DETECTED
Opiate, Ur Screen: NOT DETECTED
PHENCYCLIDINE (PCP) UR S: NOT DETECTED
TRICYCLIC, UR SCREEN: NOT DETECTED

## 2016-07-01 LAB — URINALYSIS, COMPLETE (UACMP) WITH MICROSCOPIC
GLUCOSE, UA: 50 mg/dL — AB
KETONES UR: NEGATIVE mg/dL
Nitrite: POSITIVE — AB
PROTEIN: 100 mg/dL — AB
Specific Gravity, Urine: 1.02 (ref 1.005–1.030)
pH: 5 (ref 5.0–8.0)

## 2016-07-01 LAB — COMPREHENSIVE METABOLIC PANEL
ALK PHOS: 89 U/L (ref 38–126)
ALT: 25 U/L (ref 17–63)
AST: 116 U/L — ABNORMAL HIGH (ref 15–41)
Albumin: 1.1 g/dL — ABNORMAL LOW (ref 3.5–5.0)
Anion gap: 24 — ABNORMAL HIGH (ref 5–15)
BILIRUBIN TOTAL: 3.6 mg/dL — AB (ref 0.3–1.2)
BUN: 13 mg/dL (ref 6–20)
CALCIUM: 8.3 mg/dL — AB (ref 8.9–10.3)
CO2: 14 mmol/L — ABNORMAL LOW (ref 22–32)
Chloride: 102 mmol/L (ref 101–111)
Creatinine, Ser: 2.4 mg/dL — ABNORMAL HIGH (ref 0.61–1.24)
GFR calc non Af Amer: 26 mL/min — ABNORMAL LOW (ref >60)
GFR, EST AFRICAN AMERICAN: 30 mL/min — AB (ref >60)
Glucose, Bld: 114 mg/dL — ABNORMAL HIGH (ref 65–99)
POTASSIUM: 2.7 mmol/L — AB (ref 3.5–5.1)
Sodium: 140 mmol/L (ref 135–145)
TOTAL PROTEIN: 5.9 g/dL — AB (ref 6.5–8.1)

## 2016-07-01 LAB — PHOSPHORUS
PHOSPHORUS: 7.5 mg/dL — AB (ref 2.5–4.6)
Phosphorus: 7.7 mg/dL — ABNORMAL HIGH (ref 2.5–4.6)

## 2016-07-01 LAB — GLUCOSE, CAPILLARY
GLUCOSE-CAPILLARY: 99 mg/dL (ref 65–99)
Glucose-Capillary: 127 mg/dL — ABNORMAL HIGH (ref 65–99)
Glucose-Capillary: 97 mg/dL (ref 65–99)
Glucose-Capillary: 86 mg/dL (ref 65–99)

## 2016-07-01 LAB — PROTIME-INR
INR: 7
INR: 9.46
Prothrombin Time: 62.6 seconds — ABNORMAL HIGH (ref 11.4–15.2)
Prothrombin Time: 79.7 seconds — ABNORMAL HIGH (ref 11.4–15.2)

## 2016-07-01 LAB — PROCALCITONIN: PROCALCITONIN: 1.05 ng/mL

## 2016-07-01 LAB — LACTIC ACID, PLASMA
LACTIC ACID, VENOUS: 15.8 mmol/L — AB (ref 0.5–1.9)
LACTIC ACID, VENOUS: 16.9 mmol/L — AB (ref 0.5–1.9)
Lactic Acid, Venous: 15.3 mmol/L (ref 0.5–1.9)
Lactic Acid, Venous: 17 mmol/L (ref 0.5–1.9)

## 2016-07-01 LAB — TROPONIN I
TROPONIN I: 0.09 ng/mL — AB (ref <0.03)
TROPONIN I: 0.09 ng/mL — AB (ref <0.03)

## 2016-07-01 LAB — PATHOLOGIST SMEAR REVIEW

## 2016-07-01 LAB — APTT
APTT: 58 s — AB (ref 24–36)
aPTT: 50 seconds — ABNORMAL HIGH (ref 24–36)

## 2016-07-01 LAB — ECHOCARDIOGRAM COMPLETE: WEIGHTICAEL: 2400 [oz_av]

## 2016-07-01 LAB — CKMB (ARMC ONLY): CK, MB: 7 ng/mL — ABNORMAL HIGH (ref 0.5–5.0)

## 2016-07-01 LAB — LIPASE, BLOOD: LIPASE: 12 U/L (ref 11–51)

## 2016-07-01 LAB — CREATININE, SERUM
Creatinine, Ser: 2.25 mg/dL — ABNORMAL HIGH (ref 0.61–1.24)
GFR calc Af Amer: 32 mL/min — ABNORMAL LOW (ref >60)
GFR calc non Af Amer: 28 mL/min — ABNORMAL LOW (ref >60)

## 2016-07-01 LAB — MRSA PCR SCREENING: MRSA BY PCR: NEGATIVE

## 2016-07-01 LAB — TYPE AND SCREEN
ABO/RH(D): O POS
ANTIBODY SCREEN: NEGATIVE

## 2016-07-01 LAB — ACETAMINOPHEN LEVEL: Acetaminophen (Tylenol), Serum: <10 ug/mL — ABNORMAL LOW (ref 10–30)

## 2016-07-01 LAB — AMMONIA: AMMONIA: 166 umol/L — AB (ref 9–35)

## 2016-07-01 LAB — SALICYLATE LEVEL: Salicylate Lvl: <7 mg/dL (ref 2.8–30.0)

## 2016-07-01 LAB — PHENYTOIN LEVEL, TOTAL: Phenytoin Lvl: <2.5 ug/mL — ABNORMAL LOW (ref 10.0–20.0)

## 2016-07-01 LAB — AMYLASE: Amylase: 31 U/L (ref 28–100)

## 2016-07-01 LAB — MAGNESIUM
Magnesium: 1.1 mg/dL — ABNORMAL LOW (ref 1.7–2.4)
Magnesium: 1.5 mg/dL — ABNORMAL LOW (ref 1.7–2.4)

## 2016-07-01 LAB — ETHANOL

## 2016-07-01 MED ORDER — FENTANYL CITRATE (PF) 100 MCG/2ML IJ SOLN
50.0000 ug | INTRAMUSCULAR | Status: DC | PRN
  Filled 2016-07-01: qty 2

## 2016-07-01 MED ORDER — MIDAZOLAM HCL 2 MG/2ML IJ SOLN: 1.0000 mg | INTRAMUSCULAR | Status: DC | PRN

## 2016-07-01 MED ORDER — DEXTROSE 5 % IV SOLN: 0.0000 ug/min | INTRAVENOUS | Status: DC

## 2016-07-01 MED ORDER — FAMOTIDINE IN NACL 20-0.9 MG/50ML-% IV SOLN
20.0000 mg | INTRAVENOUS | Status: DC
  Administered 2016-07-01: 20 mg via INTRAVENOUS
  Filled 2016-07-01: qty 50

## 2016-07-01 MED ORDER — ETOMIDATE 2 MG/ML IV SOLN
INTRAVENOUS | Status: AC | PRN
  Administered 2016-07-01: 20 mg via INTRAVENOUS

## 2016-07-01 MED ORDER — ACETAMINOPHEN 650 MG RE SUPP: 650.0000 mg | Freq: Four times a day (QID) | RECTAL | Status: DC | PRN

## 2016-07-01 MED ORDER — MAGNESIUM SULFATE 2 GM/50ML IV SOLN
2.0000 g | Freq: Once | INTRAVENOUS | Status: AC
  Administered 2016-07-01: 2 g via INTRAVENOUS

## 2016-07-01 MED ORDER — LEVOFLOXACIN IN D5W 500 MG/100ML IV SOLN: 500.0000 mg | INTRAVENOUS | Status: DC

## 2016-07-01 MED ORDER — VITAMIN K1 10 MG/ML IJ SOLN
10.0000 mg | Freq: Once | INTRAMUSCULAR | Status: AC
  Administered 2016-07-01: 10 mg via SUBCUTANEOUS
  Filled 2016-07-01: qty 1

## 2016-07-01 MED ORDER — FENTANYL BOLUS VIA INFUSION
25.0000 ug | INTRAVENOUS | Status: DC | PRN
  Filled 2016-07-01: qty 25

## 2016-07-01 MED ORDER — FENTANYL CITRATE (PF) 100 MCG/2ML IJ SOLN: 50.0000 ug | INTRAMUSCULAR | Status: DC | PRN

## 2016-07-01 MED ORDER — FENTANYL 2500MCG IN NS 250ML (10MCG/ML) PREMIX INFUSION: 25.0000 ug/h | INTRAVENOUS | Status: DC

## 2016-07-01 MED ORDER — HYDROCORTISONE NA SUCCINATE PF 100 MG IJ SOLR
50.0000 mg | Freq: Four times a day (QID) | INTRAMUSCULAR | Status: DC
  Administered 2016-07-01: 50 mg via INTRAVENOUS
  Filled 2016-07-01: qty 2

## 2016-07-01 MED ORDER — SODIUM CHLORIDE 0.9 % IV BOLUS (SEPSIS)
1000.0000 mL | Freq: Once | INTRAVENOUS | Status: AC
  Administered 2016-07-01: 1000 mL via INTRAVENOUS

## 2016-07-01 MED ORDER — FOLIC ACID 5 MG/ML IJ SOLN
1.0000 mg | Freq: Every day | INTRAMUSCULAR | Status: DC
  Administered 2016-07-01: 1 mg via INTRAVENOUS
  Filled 2016-07-01 (×2): qty 0.2

## 2016-07-01 MED ORDER — MEROPENEM-SODIUM CHLORIDE 1 GM/50ML IV SOLR
1.0000 g | Freq: Two times a day (BID) | INTRAVENOUS | Status: DC
  Administered 2016-07-01: 1 g via INTRAVENOUS
  Filled 2016-07-01 (×2): qty 50

## 2016-07-01 MED ORDER — SODIUM CHLORIDE 0.9 % IV BOLUS (SEPSIS)
250.0000 mL | Freq: Once | INTRAVENOUS | Status: AC
  Administered 2016-07-01: 250 mL via INTRAVENOUS

## 2016-07-01 MED ORDER — FENTANYL CITRATE (PF) 100 MCG/2ML IJ SOLN: 50.0000 ug | Freq: Once | INTRAMUSCULAR | Status: DC

## 2016-07-01 MED ORDER — SODIUM BICARBONATE 8.4 % IV SOLN
100.0000 meq | Freq: Once | INTRAVENOUS | Status: AC
  Administered 2016-07-01: 100 meq via INTRAVENOUS

## 2016-07-01 MED ORDER — THIAMINE HCL 100 MG/ML IJ SOLN
100.0000 mg | Freq: Every day | INTRAMUSCULAR | Status: DC
  Administered 2016-07-01: 100 mg via INTRAVENOUS
  Filled 2016-07-01: qty 2

## 2016-07-01 MED ORDER — MIDAZOLAM HCL 2 MG/2ML IJ SOLN
2.0000 mg | Freq: Once | INTRAMUSCULAR | Status: AC
  Administered 2016-07-01: 2 mg via INTRAVENOUS

## 2016-07-01 MED ORDER — SODIUM CHLORIDE 0.9 % IV SOLN
30.0000 meq | Freq: Once | INTRAVENOUS | Status: AC
  Administered 2016-07-01: 30 meq via INTRAVENOUS
  Filled 2016-07-01: qty 15

## 2016-07-01 MED ORDER — IPRATROPIUM-ALBUTEROL 0.5-2.5 (3) MG/3ML IN SOLN
3.0000 mL | Freq: Four times a day (QID) | RESPIRATORY_TRACT | Status: DC
  Administered 2016-07-01 (×2): 3 mL via RESPIRATORY_TRACT
  Filled 2016-07-01 (×2): qty 3

## 2016-07-01 MED ORDER — SODIUM CHLORIDE 0.9 % IV SOLN
5.0000 mg/h | INTRAVENOUS | Status: DC
  Filled 2016-07-01: qty 10

## 2016-07-01 MED ORDER — SODIUM CHLORIDE 0.9 % IV SOLN
1.0000 g | Freq: Two times a day (BID) | INTRAVENOUS | Status: DC
  Filled 2016-07-01: qty 1

## 2016-07-01 MED ORDER — LACTULOSE 10 GM/15ML PO SOLN: 30.0000 g | Freq: Two times a day (BID) | ORAL | Status: DC

## 2016-07-01 MED ORDER — PHENYTOIN SODIUM 50 MG/ML IJ SOLN
100.0000 mg | Freq: Three times a day (TID) | INTRAMUSCULAR | Status: DC
  Administered 2016-07-01 (×2): 100 mg via INTRAVENOUS
  Filled 2016-07-01 (×3): qty 2

## 2016-07-01 MED ORDER — DEXMEDETOMIDINE HCL IN NACL 400 MCG/100ML IV SOLN
0.4000 ug/kg/h | INTRAVENOUS | Status: DC
  Administered 2016-07-01 (×2): 1 ug/kg/h via INTRAVENOUS
  Filled 2016-07-01 (×2): qty 100

## 2016-07-01 MED ORDER — FENTANYL CITRATE (PF) 100 MCG/2ML IJ SOLN
100.0000 ug | Freq: Once | INTRAMUSCULAR | Status: AC
  Administered 2016-07-01: 100 ug via INTRAVENOUS

## 2016-07-01 MED ORDER — FENTANYL 2500MCG IN NS 250ML (10MCG/ML) PREMIX INFUSION
25.0000 ug/h | INTRAVENOUS | Status: DC
  Administered 2016-07-01: 100 ug/h via INTRAVENOUS

## 2016-07-01 MED ORDER — VANCOMYCIN HCL IN DEXTROSE 1-5 GM/200ML-% IV SOLN
1000.0000 mg | Freq: Once | INTRAVENOUS | Status: AC
  Administered 2016-07-01: 1000 mg via INTRAVENOUS
  Filled 2016-07-01: qty 200

## 2016-07-01 MED ORDER — SODIUM BICARBONATE 8.4 % IV SOLN
INTRAVENOUS | Status: AC
  Filled 2016-07-01: qty 150

## 2016-07-01 MED ORDER — SODIUM BICARBONATE 8.4 % IV SOLN
INTRAVENOUS | Status: AC
  Filled 2016-07-01: qty 200

## 2016-07-01 MED ORDER — MIDAZOLAM HCL 2 MG/2ML IJ SOLN
1.0000 mg | INTRAMUSCULAR | Status: DC | PRN
  Filled 2016-07-01: qty 2

## 2016-07-01 MED ORDER — NOREPINEPHRINE BITARTRATE 1 MG/ML IV SOLN
0.0000 ug/min | INTRAVENOUS | Status: DC
  Administered 2016-07-01: 18 ug/min via INTRAVENOUS
  Filled 2016-07-01: qty 16

## 2016-07-01 MED ORDER — VASOPRESSIN 20 UNIT/ML IV SOLN
0.0300 [IU]/min | INTRAVENOUS | Status: DC
  Administered 2016-07-01: 0.03 [IU]/min via INTRAVENOUS
  Filled 2016-07-01: qty 2

## 2016-07-01 MED ORDER — LORAZEPAM 2 MG/ML IJ SOLN
1.0000 mg | Freq: Once | INTRAMUSCULAR | Status: AC
  Administered 2016-07-01: 1 mg via INTRAVENOUS

## 2016-07-01 MED ORDER — LORAZEPAM 2 MG/ML IJ SOLN
INTRAMUSCULAR | Status: AC
  Administered 2016-07-01: 1 mg via INTRAVENOUS
  Filled 2016-07-01: qty 1

## 2016-07-01 MED ORDER — ENOXAPARIN SODIUM 40 MG/0.4ML ~~LOC~~ SOLN: 40.0000 mg | SUBCUTANEOUS | Status: DC

## 2016-07-01 MED ORDER — FENTANYL BOLUS VIA INFUSION: 25.0000 ug | INTRAVENOUS | Status: DC | PRN

## 2016-07-01 MED ORDER — SODIUM BICARBONATE 8.4 % IV SOLN
INTRAVENOUS | Status: DC
  Administered 2016-07-01: 16:00:00 via INTRAVENOUS
  Filled 2016-07-01 (×3): qty 150

## 2016-07-01 MED ORDER — MAGNESIUM SULFATE 4 GM/100ML IV SOLN
4.0000 g | Freq: Once | INTRAVENOUS | Status: AC
  Administered 2016-07-01: 4 g via INTRAVENOUS
  Filled 2016-07-01: qty 100

## 2016-07-01 MED ORDER — FENTANYL 2500MCG IN NS 250ML (10MCG/ML) PREMIX INFUSION
25.0000 ug/h | INTRAVENOUS | Status: DC
  Filled 2016-07-01: qty 250

## 2016-07-01 MED ORDER — VITAL AF 1.2 CAL PO LIQD
1000.0000 mL | ORAL | Status: DC
  Administered 2016-07-01: 1000 mL

## 2016-07-01 MED ORDER — METHYLPREDNISOLONE SODIUM SUCC 125 MG IJ SOLR
60.0000 mg | Freq: Four times a day (QID) | INTRAMUSCULAR | Status: DC
  Administered 2016-07-01: 60 mg via INTRAVENOUS
  Filled 2016-07-01: qty 2

## 2016-07-01 MED ORDER — ONDANSETRON HCL 4 MG/2ML IJ SOLN: 4.0000 mg | Freq: Four times a day (QID) | INTRAMUSCULAR | Status: DC | PRN

## 2016-07-01 MED ORDER — ONDANSETRON HCL 4 MG PO TABS: 4.0000 mg | ORAL_TABLET | Freq: Four times a day (QID) | ORAL | Status: DC | PRN

## 2016-07-01 MED ORDER — VITAL HIGH PROTEIN PO LIQD: 1000.0000 mL | ORAL | Status: DC

## 2016-07-01 MED ORDER — PRO-STAT SUGAR FREE PO LIQD
30.0000 mL | Freq: Every day | ORAL | Status: DC
  Administered 2016-07-01: 30 mL

## 2016-07-01 MED ORDER — SODIUM CHLORIDE 0.9 % IV SOLN: INTRAVENOUS | Status: DC

## 2016-07-01 MED ORDER — SUCCINYLCHOLINE CHLORIDE 20 MG/ML IJ SOLN
INTRAMUSCULAR | Status: AC | PRN
  Administered 2016-07-01: 60 mg via INTRAVENOUS

## 2016-07-01 MED ORDER — NOREPINEPHRINE BITARTRATE 1 MG/ML IV SOLN
0.0000 ug/min | Freq: Once | INTRAVENOUS | Status: AC
  Administered 2016-07-01: 2 ug/min via INTRAVENOUS
  Filled 2016-07-01: qty 4

## 2016-07-01 MED ORDER — ADULT MULTIVITAMIN LIQUID CH
15.0000 mL | Freq: Every day | ORAL | Status: DC
  Administered 2016-07-01: 15 mL via ORAL
  Filled 2016-07-01: qty 15

## 2016-07-01 MED ORDER — DOCUSATE SODIUM 50 MG/5ML PO LIQD: 100.0000 mg | Freq: Two times a day (BID) | ORAL | Status: DC | PRN

## 2016-07-01 MED ORDER — SODIUM BICARBONATE 8.4 % IV SOLN
50.0000 meq | Freq: Once | INTRAVENOUS | Status: AC
  Administered 2016-07-01: 50 meq via INTRAVENOUS

## 2016-07-01 MED ORDER — LEVOFLOXACIN IN D5W 750 MG/150ML IV SOLN
750.0000 mg | Freq: Once | INTRAVENOUS | Status: AC
  Administered 2016-07-01: 750 mg via INTRAVENOUS
  Filled 2016-07-01: qty 150

## 2016-07-01 MED ORDER — LACTULOSE 10 GM/15ML PO SOLN
30.0000 g | Freq: Two times a day (BID) | ORAL | Status: DC
  Administered 2016-07-01: 30 g
  Filled 2016-07-01: qty 60

## 2016-07-01 MED ORDER — ACETAMINOPHEN 325 MG PO TABS: 650.0000 mg | ORAL_TABLET | Freq: Four times a day (QID) | ORAL | Status: DC | PRN

## 2016-07-02 LAB — BLOOD CULTURE ID PANEL (REFLEXED)
Acinetobacter baumannii: NOT DETECTED
CANDIDA GLABRATA: NOT DETECTED
CANDIDA KRUSEI: NOT DETECTED
CANDIDA TROPICALIS: NOT DETECTED
Candida albicans: NOT DETECTED
Candida parapsilosis: NOT DETECTED
ENTEROBACTERIACEAE SPECIES: NOT DETECTED
ENTEROCOCCUS SPECIES: NOT DETECTED
Enterobacter cloacae complex: NOT DETECTED
Escherichia coli: NOT DETECTED
HAEMOPHILUS INFLUENZAE: NOT DETECTED
KLEBSIELLA OXYTOCA: NOT DETECTED
Klebsiella pneumoniae: NOT DETECTED
Listeria monocytogenes: NOT DETECTED
NEISSERIA MENINGITIDIS: NOT DETECTED
PSEUDOMONAS AERUGINOSA: NOT DETECTED
Proteus species: NOT DETECTED
SERRATIA MARCESCENS: NOT DETECTED
STAPHYLOCOCCUS AUREUS BCID: NOT DETECTED
STAPHYLOCOCCUS SPECIES: NOT DETECTED
STREPTOCOCCUS AGALACTIAE: NOT DETECTED
STREPTOCOCCUS PNEUMONIAE: NOT DETECTED
Streptococcus pyogenes: NOT DETECTED
Streptococcus species: NOT DETECTED

## 2016-07-02 NOTE — Progress Notes (Signed)
Chaplain provided spiritual and grief support to patient's large family. Twenty-five people were present as patient was actively dying. Among family members, there was the patient's Renato Gails who prayed a powerful prayer to comfort the family. All family members surrounded the patient's body and held hands as the [place] prayed. Chaplain was part of this prayer and continued to support the family after their Pastor left.   Aug 01, 2016 0000  Clinical Encounter Type  Visited With Patient and family together  Visit Type Initial  Referral From Nurse  Consult/Referral To Chaplain  Spiritual Encounters  Spiritual Needs Prayer;Grief support

## 2016-07-04 LAB — CULTURE, BLOOD (ROUTINE X 2): SPECIAL REQUESTS: ADEQUATE

## 2016-07-04 LAB — URINE CULTURE

## 2016-07-05 ENCOUNTER — Telehealth: Payer: Self-pay | Admitting: *Deleted

## 2016-07-05 LAB — BLOOD GAS, ARTERIAL
ACID-BASE DEFICIT: 14.8 mmol/L — AB (ref 0.0–2.0)
ACID-BASE DEFICIT: 18.1 mmol/L — AB (ref 0.0–2.0)
ACID-BASE DEFICIT: 18.8 mmol/L — AB (ref 0.0–2.0)
BICARBONATE: 10.3 mmol/L — AB (ref 20.0–28.0)
Bicarbonate: 11.1 mmol/L — ABNORMAL LOW (ref 20.0–28.0)
Bicarbonate: 11.8 mmol/L — ABNORMAL LOW (ref 20.0–28.0)
FIO2: 1
FIO2: 1
FIO2: 1
O2 SAT: 87.4 %
O2 SAT: 91.6 %
O2 SAT: 67.5 %
PATIENT TEMPERATURE: 37
PCO2 ART: 22 mmHg — AB (ref 32.0–48.0)
PCO2 ART: 40 mmHg (ref 32.0–48.0)
PH ART: 7.28 — AB (ref 7.350–7.450)
PO2 ART: 61 mmHg — AB (ref 83.0–108.0)
Patient temperature: 37
Patient temperature: 37
pCO2 arterial: 50 mmHg — ABNORMAL HIGH (ref 32.0–48.0)
pH, Arterial: 7.05 — CL (ref 7.350–7.450)
pH, Arterial: 6.98 — CL (ref 7.350–7.450)
pO2, Arterial: 89 mmHg (ref 83.0–108.0)
pO2, Arterial: 56 mmHg — ABNORMAL LOW (ref 83.0–108.0)

## 2016-07-05 NOTE — Telephone Encounter (Signed)
received death cert from Daniel from [facility] for pt. Placed in DK's folder since DR not available.

## 2016-07-06 LAB — CULTURE, BLOOD (ROUTINE X 2): CULTURE: NO GROWTH

## 2016-07-06 NOTE — Telephone Encounter (Signed)
Called funeral home and informed death certificate is ready for pick up. Will place up front.

## 2016-07-26 NOTE — ED Notes (Signed)
bair hugger applied.

## 2016-07-26 NOTE — Consult Note (Addendum)
PULMONARY / CRITICAL CARE MEDICINE   Name: Larry Castaneda MRN: [id number] DOB: 1945/11/01    ADMISSION DATE:  07/20/2016 CONSULTATION DATE:  07/20/2016  REFERRING MD:  Dr. Allena Katz  CHIEF COMPLAINT: Altered Mental Status   HISTORY OF PRESENT ILLNESS:   This is a 71 yo male with a PMH of Seizures, HTN., COPD, Current Everyday Smoker 1 PPD, Recurrent Falls, Pulmonary Nodule (CT Chest results 12/03/2015 revealed right upper lobe 6 mm nodule), Thrombocytopenia, and ETOH Abuse for several yrs.  He presented to [facility] ER 04/6 with altered mental status.  Per ER notes EMS reported the pt was last seen in his usual state of health at 09:00 pm on 04/5.  This morning 04/6 around 08:00 am the pt was found unresponsive by his roommate.  Upon EMS arrival the pts CBG was 51 he received D50W recheck CBG was 120, his bp was 72/50 he received a 500 ml fluid bolus BP improved 118/70.  In the ER it was noted the pt was tachypneic with labored respirations and he had an upward gaze he was subsequently mechanically intubated.  CXR revealed bilateral pneumonia and pts lactic acid was elevated at 15.3 therefore sepsis protocol initiated pt received 2.5L IV fluid bolus.  According to pts family he is a heavy drinker and drinks liquor daily, however over the past 2-3 days he has been too weak to drink and has had a decreased appetite.  They also stated the pt has been extremely weak and has lost an estimated 50 lbs over the past year. PCCM consulted for management of acute on chronic respiratory failure secondary to bilateral pneumonia with metabolic acidosis requiring mechanical ventilation, Acute encephalopathy, and Acute renal failure.  PAST MEDICAL HISTORY :  He  has a past medical history of COPD (chronic obstructive pulmonary disease) (HCC); Hypertension; and Seizures (HCC).  PAST SURGICAL HISTORY: He  has a past surgical history that includes none.  Allergies  Allergen Reactions  . Penicillins Swelling    No  current facility-administered medications on file prior to encounter.    Current Outpatient Prescriptions on File Prior to Encounter  Medication Sig  . metoprolol tartrate (LOPRESSOR) 25 MG tablet Take 0.5 tablets (12.5 mg total) by mouth 2 (two) times daily.  . phenytoin (DILANTIN) 100 MG ER capsule Take 200 mg by mouth at bedtime.   . potassium chloride 20 MEQ TBCR Take 10 mEq by mouth 2 (two) times daily. (Patient taking differently: Take 10 mEq by mouth daily. )    FAMILY HISTORY:  His indicated that his mother is deceased. He indicated that his father is deceased.    SOCIAL HISTORY: He  reports that he has been smoking Cigarettes.  He has been smoking about 1.00 pack per day. He has never used smokeless tobacco. He reports that he drinks about 1.2 oz of alcohol per week . He reports that he does not use drugs.  REVIEW OF SYSTEMS:   Unable to assess pt mechanically intubated.  SUBJECTIVE:  Pt mechanically intubated with labored respirations.  VITAL SIGNS: BP (!) 89/59   Pulse 94   Temp (!) 95.2 F (35.1 C)   Resp (!) 31   Wt 68 kg (150 lb)   SpO2 91%   BMI 19.79 kg/m   HEMODYNAMICS:    VENTILATOR SETTINGS: Vent Mode: PRVC FiO2 (%):  [100 %] 100 % Set Rate:  [14 bmp] 14 bmp Vt Set:  [450 mL] 450 mL PEEP:  [5 cmH20] 5 cmH20 Plateau Pressure:  [  9 cmH20] 9 cmH20  INTAKE / OUTPUT: No intake/output data recorded.  PHYSICAL EXAMINATION: General: acutely ill AA male, mechanically intubated Neuro: unresponsive, does not follow commands, right pupil 2.5 mm reactive, left pupil 2 reactive HEENT: supple, mild JVD Cardiovascular: nsr, s1s2, no M/R/G Lungs: rhonchi throughout, labored mechanically ventilated Abdomen: hypoactive BS x4, mildly distended, soft  Musculoskeletal: normal bulk and tone Skin: stage III pressure ulcer sacram and stage II pressure ulcer sacrum  LABS:  BMET  Recent Labs Lab 07-24-16 0841  NA 140  K 2.7*  CL 102  CO2 14*  BUN 13   CREATININE 2.40*  GLUCOSE 114*    Electrolytes  Recent Labs Lab 07/24/2016 0841  CALCIUM 8.3*    CBC  Recent Labs Lab 24-Jul-2016 0841 2016/07/24 1259  WBC 12.2* 10.0  HGB 9.5* 8.2*  HCT 29.3* 26.2*  PLT 141* 137*    Coag's  Recent Labs Lab 07-24-2016 0841  APTT 50*  INR 7.00*    Sepsis Markers  Recent Labs Lab 2016/07/24 0841 July 24, 2016 1037  LATICACIDVEN 15.3* 15.8*    ABG  Recent Labs Lab July 24, 2016 0842 2016/07/24 1310  PHART 7.28* 7.05*  PCO2ART 22* 40  PO2ART 61* 89    Liver Enzymes  Recent Labs Lab 07-24-16 0841  AST 116*  ALT 25  ALKPHOS 89  BILITOT 3.6*  ALBUMIN 1.1*    Cardiac Enzymes No results for input(s): TROPONINI, PROBNP in the last 168 hours.  Glucose  Recent Labs Lab Jul 24, 2016 1210  GLUCAP 127*    Imaging Dg Chest 1 View  Result Date: 24-Jul-2016 CLINICAL DATA:  Intubated. EXAM: CHEST 1 VIEW COMPARISON:  Earlier today. FINDINGS: Interval endotracheal tube in satisfactory position. Interval nasogastric tube extending into the stomach. Normal sized heart. No significant change in bilateral patchy opacities. Mild aortic calcification. No acute bony abnormality. IMPRESSION: 1. Endotracheal tube in satisfactory position. 2. Stable bilateral pneumonia. 3. Mild aortic atherosclerosis. Electronically Signed   By: Beckie Salts M.D.   On: 07/24/2016 10:24   Ct Head Wo Contrast  Result Date: 07-24-2016 CLINICAL DATA:  Altered mental status EXAM: CT HEAD WITHOUT CONTRAST TECHNIQUE: Contiguous axial images were obtained from the base of the skull through the vertex without intravenous contrast. COMPARISON:  CT head 10/30/2014 FINDINGS: Brain: Mild to moderate atrophy. He is white matter hypodensity has progressed in the interval. Atrophy has progressed since the prior study. Negative for acute infarct. Negative for hemorrhage or mass. No shift of the midline structures. Vascular: Negative for hyperdense vessel.  Atherosclerotic disease. Skull:  Negative Sinuses/Orbits: Negative Other: None IMPRESSION: No acute abnormality Progression of atrophy and chronic microvascular ischemia in the white matter since 10/30/2014 Electronically Signed   By: Marlan Palau M.D.   On: 07/24/2016 09:27   Dg Chest Portable 1 View  Result Date: 07/24/2016 CLINICAL DATA:  Central line placement. EXAM: PORTABLE CHEST 1 VIEW COMPARISON:  Chest x-ray 07/24/16, 2016/07/24, 12/23/2015. FINDINGS: Interim placement right IJ line, its tip is projected over the right atrium. Endotracheal tube and NG tube in stable position. Heart size normal. Diffuse bilateral pulmonary infiltrates again noted consistent pneumonia. No interim change. No prominent pleural effusion. No pneumothorax. IMPRESSION: 1. Interim placement of right IJ line, its tip is projected over the right atrium. Remaining lines and tubes in stable position. 2. Persistent diffuse bilateral pulmonary infiltrates consistent with pneumonia. No change prior exams. Electronically Signed   By: Maisie Fus  Register   On: July 24, 2016 11:47   Dg Chest Port 1 [address]  Result Date: 2016/07/26 CLINICAL DATA:  Altered mental status.  Weakness. EXAM: PORTABLE CHEST 1 VIEW COMPARISON:  Chest x-ray 12/23/2015 . FINDINGS: Patient rotated to the left. Mediastinum appears stable. Heart size stable. Diffuse multifocal bilateral pulmonary infiltrates are noted most consistent with pneumonia. COPD. No pleural effusion or pneumothorax. Calcification of the axillary artery noted consistent atherosclerotic vascular disease. Degenerative changes both shoulders and thoracic spine. IMPRESSION: 1. Diffuse multifocal pulmonary infiltrates consistent with pneumonia. These findings are new from prior study of 12/23/2015. 2.  COPD. 3. Calcification of the axillary arteries noted bilaterally consistent with atherosclerotic vascular disease. Electronically Signed   By: Maisie Fus  Register   On: 07-26-16 09:01   STUDIES:  CT Head 04/6>>No acute abnormality  Progression of atrophy and chronic microvascular ischemia in the white matter since 10/30/2014  CULTURES: Blood x2 04/6>> Urine 04/6>>  ANTIBIOTICS: Levaquin 04/6>> Vancomycin 04/6>>  SIGNIFICANT EVENTS: 04/6-Pt admitted to [facility] ICU with acute on chronic respiratory failure secondary to bilateral pneumonia with metabolic acidosis mechanically intubated, acute encephalopathy, and acute renal failure PCCM consulted for management   LINES/TUBES: Right IJ CVL 04/6>> ETT 04/6>>  ASSESSMENT / PLAN:  PULMONARY A: Acute on chronic respiratory failure secondary to bilateral pneumonia with metabolic acidosis  Mechanical Intubation  Hx: COPD, 6 mm RUL Pulmonary Nodule, and Current Everyday Smoker P:   Full vent support SBT once pt meets parameters  Scheduled bronchodilator therapy  Continue iv steroids  VAP Bundle Repeat ABG today  CXR in the am  He may need a CT Chest once creatinine improves to assess pulmonary nodule and rule out malignancy   CARDIOVASCULAR A:  Septic shock secondary to bilateral pneumonia Mildly elevated troponin's likely secondary to demand ischemia due to respiratory failure P:  Prn levophed gtt and fluid resuscitation to maintain map >65 Trend troponin's Echo pending CVP's q4hrs  RENAL A:   Acute renal failure secondary to hypotension Hypokalemia Hypomagnesium  Hyperphosphatemia  Metabolic Acidosis  Lactic Acidosis  P:   Trend BMP's Replace electrolytes as indicated Monitor UOP If creatinine worsens or remains oliguric will consult Nephrology 3 amps Sodium Bicarb and initiate Sodium Bicarb gtt @ 100 ml/hr Trend lactic acid   GASTROINTESTINAL A:   No acute issues Hx: Hepatic Steatosis  P:   Pepcid for PUD prophylaxis Will start tube feeds Ultrasound RUQ pending  HEMATOLOGIC A:   Anemia without acute blood loss Elevated PT/INR, PTT, and AST  Acute on Chronic Thrombocytopenia  P:  Avoid chemical VTE prophylaxis, SCD's for VTE  prophylaxis Trend CBC Monitor for s/sx of bleeding Transfuse for hgb <7 Repeat coags in the am  10 mg Vitamin K x1 dose If pt shows s/sx of active bleeding will transfuse FFP   INFECTIOUS A:   Bilateral Pneumonia  P:   Trend WBC and monitor fever curve Trend PCT's and lactic acid  Follow cultures Continue abx as listed above   ENDOCRINE A:   Hypoglycemia  P:   CBG q2hrs for now Follow Hypo/Hyperglycemic protocol   NEUROLOGIC A:   Acute encephalopathy secondary to sepsis, respiratory failure, ?seizure activity, elevated ammonia, and ?Delirium Tremors  Hx: ETOH Abuse  P:   RASS goal: 0 to -1 Precedex gtt and prn Versed to maintain RASS goal  Prn Fentanyl for pain management Continue dilantin recheck dilantin level in the am  Thiamine, folic acid, MVI, and lactulose   EEG pending  Repeat ammonia level in am  Urine Drug Screen pending  Seizure precautions CIWA protocol WUA daily  If mentation does not improve will consult Neurology   FAMILY  - Updates: Pts family updated regarding plan of care and all questions answered 07/13/16.  Discussed code status with pts family they stated at this time they would like the pt to remain a FULL CODE   - Inter-disciplinary family meet or Palliative Care meeting due by: 07/08/2016   Sonda Rumble, AGNP  Pulmonary/Critical Care Pager [phone] (please enter 7 digits) PCCM Consult Pager [phone] (please enter 7 digits)  Pt seen and examined with NP, agree with assessment and plan above.  71 yo male, presented with acute respiratory failure/distress, intubated in the ED.  I personally reviewed CXR images, labs, databases. These are consistent with acute hypoxic respiratory failure from bilateral pneumonia, in the setting of severe emphysema and cirrhosis with coagulopathy and hepatic encephalopathy.  Will treatment with broad spectrum abx, Vitamin K, IVF, continue vent support, monitor INR and lab values.   Wells Guiles, M.D. July 13, 2016 3:09 PM  Critical Care Attestation.  I have personally obtained a history, examined the patient, evaluated laboratory and imaging results, formulated the assessment and plan and placed orders. The Patient requires high complexity decision making for assessment and support, frequent evaluation and titration of therapies, application of advanced monitoring technologies and extensive interpretation of multiple databases. The patient has critical illness that could lead imminently to failure of 1 or more organ systems and requires the highest level of physician preparedness to intervene.  Critical Care Time devoted to patient care services described in this note is 45 minutes and is exclusive of time spent in procedures.

## 2016-07-26 NOTE — H&P (Signed)
[facility] [place] [facility]   PATIENT NAME: Larry Castaneda    MR#:  [id number]  DATE OF BIRTH:  1945/04/06  DATE OF ADMISSION:  07/04/2016  PRIMARY CARE PHYSICIAN: Harriett Pietro Cassis, MD   REQUESTING/REFERRING PHYSICIAN:  Jene Every MD  CHIEF COMPLAINT:   Chief Complaint  Patient presents with  . Altered Mental Status    HISTORY OF PRESENT ILLNESS: Larry Castaneda  is a 71 y.o. male with a known history of COPD, essential hypertension, and seizure who was found poorly responsive by his roommate and EMS was called. He was last seen normal at 9 PM last night. Patient does drink daily. In the emergency room his noted to have severe elevation of lactic acid. He is also noted to have bilateral pneumonia. Patient had to be intubated. He is also having low blood pressures.  PAST MEDICAL HISTORY:   Past Medical History:  Diagnosis Date  . COPD (chronic obstructive pulmonary disease) (HCC)   . Hypertension   . Seizures (HCC)     PAST SURGICAL HISTORY: Past Surgical History:  Procedure Laterality Date  . none      SOCIAL HISTORY:  Social History  Substance Use Topics  . Smoking status: Current Every Day Smoker    Packs/day: 1.00    Types: Cigarettes  . Smokeless tobacco: Never Used  . Alcohol use 1.2 oz/week    2 Cans of beer per week     Comment: was drinking Gin up until 3 days ago drank 1 beer 8/3    FAMILY HISTORY:  Family History  Problem Relation Age of Onset  . Alzheimer's disease Father     DRUG ALLERGIES:  Allergies  Allergen Reactions  . Penicillins Swelling    REVIEW OF SYSTEMS:   CONSTITUTIONAL:   Intubated  MEDICATIONS AT HOME:  Prior to Admission medications   Medication Sig Start Date End Date Taking? Authorizing Provider  metoprolol tartrate (LOPRESSOR) 25 MG tablet Take 0.5 tablets (12.5 mg total) by mouth 2 (two) times daily. 12/25/15  Yes Houston Siren, MD  phenytoin (DILANTIN) 100 MG ER capsule Take 200 mg by  mouth at bedtime.    Yes Historical Provider, MD  potassium chloride 20 MEQ TBCR Take 10 mEq by mouth 2 (two) times daily. Patient taking differently: Take 10 mEq by mouth daily.  12/25/15 07/21/2016 Yes Houston Siren, MD      PHYSICAL EXAMINATION:   VITAL SIGNS: Blood pressure (!) 77/52, pulse (!) 39, temperature 97.1 F (36.2 C), resp. rate (!) 37, weight 150 lb (68 kg), SpO2 99 %.  GENERAL:  71 y.o.-year-old patient lying in the bed Critically ill  EYES: Pupils equal, round, reactive to light and accommodation. No scleral icterus.  HEENT: Head atraumatic, normocephalic. ET tube in place  NECK:  Supple, no jugular venous distention. No thyroid enlargement, no tenderness.  LUNGS: Rhonchus breath sounds bilaterally with occasional wheezing Currently on the ventilator CARDIOVASCULAR: S1, S2 normal. No murmurs, rubs, or gallops.  ABDOMEN: Soft, nontender, nondistended. Bowel sounds present. No organomegaly or mass.  EXTREMITIES: No pedal edema, cyanosis, or clubbing.  NEUROLOGIC: Currently not responding PSYCHIATRIC: Nonresponsive  SKIN: No obvious rash, lesion, or ulcer.   LABORATORY PANEL:   CBC  Recent Labs Lab 07/05/2016 0841  WBC 12.2*  HGB 9.5*  HCT 29.3*  PLT 141*  MCV 108.3*  MCH 34.9*  MCHC 32.3  RDW 18.9*  LYMPHSABS 0.4*  MONOABS 1.0  EOSABS 0.1  BASOSABS 0.0   ------------------------------------------------------------------------------------------------------------------  Chemistries   Recent Labs Lab 07/07/2016 0841  NA 140  K 2.7*  CL 102  CO2 14*  GLUCOSE 114*  BUN 13  CREATININE 2.40*  CALCIUM 8.3*  AST 116*  ALT 25  ALKPHOS 89  BILITOT 3.6*   ------------------------------------------------------------------------------------------------------------------ estimated creatinine clearance is 27.2 mL/min (A) (by C-G formula based on SCr of 2.4 mg/dL  (H)). ------------------------------------------------------------------------------------------------------------------ No results for input(s): TSH, T4TOTAL, T3FREE, THYROIDAB in the last 72 hours.  Invalid input(s): FREET3   Coagulation profile  Recent Labs Lab 07/08/2016 0841  INR 7.00*   ------------------------------------------------------------------------------------------------------------------- No results for input(s): DDIMER in the last 72 hours. -------------------------------------------------------------------------------------------------------------------  Cardiac Enzymes No results for input(s): CKMB, TROPONINI, MYOGLOBIN in the last 168 hours.  Invalid input(s): CK ------------------------------------------------------------------------------------------------------------------ Invalid input(s): POCBNP  ---------------------------------------------------------------------------------------------------------------  Urinalysis    Component Value Date/Time   COLORURINE YELLOW (A) 12/23/2015 2257   APPEARANCEUR CLEAR (A) 12/23/2015 2257   LABSPEC 1.002 (L) 12/23/2015 2257   PHURINE 8.0 12/23/2015 2257   GLUCOSEU NEGATIVE 12/23/2015 2257   HGBUR NEGATIVE 12/23/2015 2257   BILIRUBINUR NEGATIVE 12/23/2015 2257   KETONESUR NEGATIVE 12/23/2015 2257   PROTEINUR NEGATIVE 12/23/2015 2257   NITRITE NEGATIVE 12/23/2015 2257   LEUKOCYTESUR 3+ (A) 12/23/2015 2257     RADIOLOGY: Dg Chest 1 View  Result Date: 06/30/2016 CLINICAL DATA:  Intubated. EXAM: CHEST 1 VIEW COMPARISON:  Earlier today. FINDINGS: Interval endotracheal tube in satisfactory position. Interval nasogastric tube extending into the stomach. Normal sized heart. No significant change in bilateral patchy opacities. Mild aortic calcification. No acute bony abnormality. IMPRESSION: 1. Endotracheal tube in satisfactory position. 2. Stable bilateral pneumonia. 3. Mild aortic atherosclerosis. Electronically  Signed   By: Beckie Salts M.D.   On: 07/10/2016 10:24   Ct Head Wo Contrast  Result Date: 07/06/2016 CLINICAL DATA:  Altered mental status EXAM: CT HEAD WITHOUT CONTRAST TECHNIQUE: Contiguous axial images were obtained from the base of the skull through the vertex without intravenous contrast. COMPARISON:  CT head 10/30/2014 FINDINGS: Brain: Mild to moderate atrophy. He is white matter hypodensity has progressed in the interval. Atrophy has progressed since the prior study. Negative for acute infarct. Negative for hemorrhage or mass. No shift of the midline structures. Vascular: Negative for hyperdense vessel.  Atherosclerotic disease. Skull: Negative Sinuses/Orbits: Negative Other: None IMPRESSION: No acute abnormality Progression of atrophy and chronic microvascular ischemia in the white matter since 10/30/2014 Electronically Signed   By: Marlan Palau M.D.   On: 07/06/2016 09:27   Dg Chest Port 1 View  Result Date: 07/10/2016 CLINICAL DATA:  Altered mental status.  Weakness. EXAM: PORTABLE CHEST 1 VIEW COMPARISON:  Chest x-ray 12/23/2015 . FINDINGS: Patient rotated to the left. Mediastinum appears stable. Heart size stable. Diffuse multifocal bilateral pulmonary infiltrates are noted most consistent with pneumonia. COPD. No pleural effusion or pneumothorax. Calcification of the axillary artery noted consistent atherosclerotic vascular disease. Degenerative changes both shoulders and thoracic spine. IMPRESSION: 1. Diffuse multifocal pulmonary infiltrates consistent with pneumonia. These findings are new from prior study of 12/23/2015. 2.  COPD. 3. Calcification of the axillary arteries noted bilaterally consistent with atherosclerotic vascular disease. Electronically Signed   By: Maisie Fus  Register   On: 07/17/2016 09:01    EKG: Orders placed or performed during the hospital encounter of 07/09/2016  . ED EKG  . ED EKG  . EKG 12-Lead  . EKG 12-Lead  . EKG 12-Lead  . EKG 12-Lead    IMPRESSION AND  PLAN: Patient is a 71 year old found to  be unresponsive  1. Acute respiratory failure This is due to acute on chronic COPD exasperation as well as bilateral pneumonia We will treat patient with IV Levaquin and vancomycin Continue ventilator support The emergency room physician has discussed the case with Dr. Linward Natal of the ICU I will also place patient on nebulizers and IV Solu-Medrol  2. Hypotension with sepsis Broad-spectrum antibiotics Levophed for blood pressure support IV fluids  3. History of alcohol abuse Monitor for withdrawal symptoms wants patient extubated  4. History of seizure disorder supposedly patient on Dilantin I will continue Dilantin IV for now check a Dilantin level  5. Acute encephalopathy suspect due to acute respiratory failure and pneumonia monitor for any signs of seizure  6. Miscellaneous Lovenox for DVT prophylaxis  All the records are reviewed and case discussed with ED provider. Management plans discussed with the patient, family and they are in agreement.  CODE STATUS: Code Status History    Date Active Date Inactive Code Status Order ID Comments User Context   12/24/2015  4:20 AM 12/24/2015  7:44 AM Full Code [id number]  Ihor Austin, MD ED   10/30/2014  9:50 PM 10/31/2014  6:22 PM Full Code [id number]  Houston Siren, MD Inpatient       TOTAL TIME TAKING CARE OF THIS PATIENT:60 minutes critical care time spent   Auburn Bilberry M.D on 07/23/2016 at 11:04 AM  Between 7am to 6pm - Pager - [phone]  After 6pm go to [email or web address] - password [email or web address] [facility]  [facility]  Office  [phone]  CC: Primary care physician; Hyman Hopes, MD

## 2016-07-26 NOTE — Progress Notes (Signed)
MEDICATION RELATED CONSULT NOTE   Pharmacy Consult for electrolyte management  Indication: hypokalemia   Pharmacy consulted for electrolyte management for 71 yo male admitted to ICU and requiring mechanical ventilation. Patient has received potassium chloride IV x2 and magnesium 4g IV x 1 and magnesium 2g IV x 1.   Plan:  No further replacement warranted at this time. Will recheck all electrolytes with am labs.  Allergies  Allergen Reactions  . Penicillins Swelling    Patient Measurements: Weight: 150 lb (68 kg)  Vital Signs: Temp: 98.1 F (36.7 C) (04/06 2200) Temp Source: Core (Comment) (04/06 1925) BP: 94/66 (04/06 2200) Pulse Rate: 112 (04/06 2100) Intake/Output from previous day: No intake/output data recorded. Intake/Output from this shift: Total I/O In: 202.8 [I.V.:152.8; NG/GT:50] Out: 5 [Urine:5]  Labs:  Recent Labs  Jul 31, 2016 0841 31-Jul-2016 1259 07/31/16 1646 31-Jul-2016 2109  WBC 12.2* 10.0  --   --   HGB 9.5* 8.2*  --   --   HCT 29.3* 26.2*  --   --   PLT 141* 137*  --   --   APTT 50* 58*  --   --   CREATININE 2.40* 2.25*  --  2.41*  MG  --  1.1* 1.5*  --   PHOS  --  7.5* 7.7*  --   ALBUMIN 1.1*  --   --   --   PROT 5.9*  --   --   --   AST 116*  --   --   --   ALT 25  --   --   --   ALKPHOS 89  --   --   --   BILITOT 3.6*  --   --   --    Estimated Creatinine Clearance: 27 mL/min (A) (by C-G formula based on SCr of 2.41 mg/dL (H)).   Microbiology: Recent Results (from the past 720 hour(s))  MRSA PCR Screening     Status: None   Collection Time: 07/31/2016 12:30 PM  Result Value Ref Range Status   MRSA by PCR NEGATIVE NEGATIVE Final    Comment:        The GeneXpert MRSA Assay (FDA approved for NASAL specimens only), is one component of a comprehensive MRSA colonization surveillance program. It is not intended to diagnose MRSA infection nor to guide or monitor treatment for MRSA infections.     Medical History: Past Medical  History:  Diagnosis Date  . COPD (chronic obstructive pulmonary disease) (HCC)   . Hypertension   . Seizures [facility])     Pharmacy will continue to monitor and adjust per consult.  Simpson,Michael L Jul 31, 2016,10:33 PM

## 2016-07-26 NOTE — ED Notes (Signed)
Intubation complete, 8.0 ETT 24@ Lip +color change

## 2016-07-26 NOTE — Death Summary Note (Signed)
DEATH SUMMARY   Patient Details  Name: Larry Castaneda MRN: [id number] DOB: 1945-06-11  Admission/Discharge Information   Admit Date:  07-20-2016  Date of Death: Date of Death: 07-20-16  Time of Death: Time of Death: 08-13-44  Length of Stay: 1  Referring Physician: Hyman Hopes, MD   Reason(s) for Hospitalization  Altered Mental Status Acute on chronic respiratory failure secondary to bilateral pneumonia  Severe Metabolic acidosis  Diagnoses  Preliminary cause of death:  Cardio Pulmonary Arrest due to severe metabolic Acidosis Secondary Diagnoses (including complications and co-morbidities):  Active Problems:   Acute respiratory failure (HCC)   Septic shock (HCC)   Alcoholic cirrhosis of liver without ascites (HCC) Acute Renal Failure Acute On Chronic Thrombocytopenia Acute encephalopathy secondary to sepsis, respiratory failure Alcohol Abuse Severe Emphysema Cirrhosis with Coagulopathy Brief Hospital Course (including significant findings, care, treatment, and services provided and events leading to death)  Larry Castaneda is a 71 y.o. year old male who was  a 71 yo male with a PMH of Seizures, HTN., COPD,  Tobacco Abuse- 1 PPD, Recurrent Falls, Pulmonary Nodule (CT Chest results 12/03/2015 revealed right upper lobe 6 mm nodule), Thrombocytopenia, and ETOH Abuse for several yrs.  He presented to [facility] ER July 21, 2022 with altered mental status.  Per ER notes EMS reported the pt was last seen in his usual state of health at 09:00 pm on 04/5.  On  morning  Of 2022-07-21 around 08:00 am the pt was found unresponsive by his roommate.  Upon EMS arrival the pts CBG was 51 he received D50W recheck CBG was 120, his bp was 72/50 he received a 500 ml fluid bolus BP improved 118/70.  In the ER it was noted the pt was tachypneic with labored respirations and he had an upward gaze he was subsequently mechanically intubated.  CXR revealed bilateral pneumonia and pts lactic acid was elevated at 15.3  therefore sepsis protocol initiated pt received 2.5L IV fluid bolus.  According to pts family he is a heavy drinker and drinks liquor daily, however over the past 2-3 days he has been too weak to drink and has had a decreased appetite.  They also stated the pt has been extremely weak and has lost an estimated 50 lbs over the past year. PCCM  Was consulted for management of acute on chronic respiratory failure secondary to bilateral pneumonia with metabolic acidosis requiring mechanical ventilation, Acute encephalopathy, and Acute renal failure. Patient was in severe metabolic acidosis and not able to maintain O2 saturation inspite of maximum vent support.  Family was made aware of the poor prognosis with multisystem organ failure.  Family decided to make him a DNR and thereafter comfort Measures only. Patient was extubated and made comfortable. Patient passed away at 23:46.  Family members and Chaplain present at the bedside.   Pertinent Labs and Studies  Significant Diagnostic Studies Dg Chest 1 View  Result Date: 07-20-16 CLINICAL DATA:  Intubated. EXAM: CHEST 1 VIEW COMPARISON:  Earlier today. FINDINGS: Interval endotracheal tube in satisfactory position. Interval nasogastric tube extending into the stomach. Normal sized heart. No significant change in bilateral patchy opacities. Mild aortic calcification. No acute bony abnormality. IMPRESSION: 1. Endotracheal tube in satisfactory position. 2. Stable bilateral pneumonia. 3. Mild aortic atherosclerosis. Electronically Signed   By: Beckie Salts M.D.   On: 20-Jul-2016 10:24   Ct Head Wo Contrast  Result Date: 20-Jul-2016 CLINICAL DATA:  Altered mental status EXAM: CT HEAD WITHOUT CONTRAST TECHNIQUE: Contiguous axial images  were obtained from the base of the skull through the vertex without intravenous contrast. COMPARISON:  CT head 10/30/2014 FINDINGS: Brain: Mild to moderate atrophy. He is white matter hypodensity has progressed in the interval. Atrophy  has progressed since the prior study. Negative for acute infarct. Negative for hemorrhage or mass. No shift of the midline structures. Vascular: Negative for hyperdense vessel.  Atherosclerotic disease. Skull: Negative Sinuses/Orbits: Negative Other: None IMPRESSION: No acute abnormality Progression of atrophy and chronic microvascular ischemia in the white matter since 10/30/2014 Electronically Signed   By: Marlan Palau M.D.   On: 06/30/2016 09:27   Dg Chest Portable 1 View  Result Date: 07/18/2016 CLINICAL DATA:  Central line placement. EXAM: PORTABLE CHEST 1 VIEW COMPARISON:  Chest x-ray 07/10/2016, 07/10/2016, 12/23/2015. FINDINGS: Interim placement right IJ line, its tip is projected over the right atrium. Endotracheal tube and NG tube in stable position. Heart size normal. Diffuse bilateral pulmonary infiltrates again noted consistent pneumonia. No interim change. No prominent pleural effusion. No pneumothorax. IMPRESSION: 1. Interim placement of right IJ line, its tip is projected over the right atrium. Remaining lines and tubes in stable position. 2. Persistent diffuse bilateral pulmonary infiltrates consistent with pneumonia. No change prior exams. Electronically Signed   By: Maisie Fus  Register   On: 07/20/2016 11:47   Dg Chest Port 1 View  Result Date: 07/04/2016 CLINICAL DATA:  Altered mental status.  Weakness. EXAM: PORTABLE CHEST 1 VIEW COMPARISON:  Chest x-ray 12/23/2015 . FINDINGS: Patient rotated to the left. Mediastinum appears stable. Heart size stable. Diffuse multifocal bilateral pulmonary infiltrates are noted most consistent with pneumonia. COPD. No pleural effusion or pneumothorax. Calcification of the axillary artery noted consistent atherosclerotic vascular disease. Degenerative changes both shoulders and thoracic spine. IMPRESSION: 1. Diffuse multifocal pulmonary infiltrates consistent with pneumonia. These findings are new from prior study of 12/23/2015. 2.  COPD. 3. Calcification of  the axillary arteries noted bilaterally consistent with atherosclerotic vascular disease. Electronically Signed   By: Maisie Fus  Register   On: 07/14/2016 09:01   US Abdomen Limited Ruq  Result Date: 06/28/2016 CLINICAL DATA:  Elevated AST and ETOH abuse. EXAM: US ABDOMEN LIMITED - RIGHT UPPER QUADRANT COMPARISON:  10/31/2014 FINDINGS: Gallbladder: Large sludge ball measuring 1.9 cm. No definite gallstones. Mild edematous gallbladder wall thickening measuring 4 mm. Mild adjacent pericholecystic fluid. Patient on ventilator as unable to assess sonographic Murphy's sign. Common bile duct: Diameter: 7 mm. Liver: Moderate diffuse increased echogenicity without focal mass. Mild ascites. Predominant hepatic few 0 flow over the portal vein. Findings suggest cirrhosis. IMPRESSION: 1.9 cm sludge ball without cholelithiasis. Nonspecific mild gallbladder wall thickening in the setting of ascites measuring 4 mm. Constellation of findings suggesting cirrhosis. Electronically Signed   By: Elberta Fortis M.D.   On: 07/20/2016 15:58    Microbiology Recent Results (from the past 240 hour(s))  MRSA PCR Screening     Status: None   Collection Time: 07/09/2016 12:30 PM  Result Value Ref Range Status   MRSA by PCR NEGATIVE NEGATIVE Final    Comment:        The GeneXpert MRSA Assay (FDA approved for NASAL specimens only), is one component of a comprehensive MRSA colonization surveillance program. It is not intended to diagnose MRSA infection nor to guide or monitor treatment for MRSA infections.     Lab Basic Metabolic Panel:  Recent Labs Lab 07/03/2016 0841 07/03/2016 1259 06/30/2016 1646 06/30/2016 2109  NA 140  --   --  149*  K 2.7*  --   --  3.6  CL 102  --   --  106  CO2 14*  --   --  19*  GLUCOSE 114*  --   --  146*  BUN 13  --   --  13  CREATININE 2.40* 2.25*  --  2.41*  CALCIUM 8.3*  --   --  7.3*  MG  --  1.1* 1.5*  --   PHOS  --  7.5* 7.7*  --    Liver Function Tests:  Recent Labs Lab  07/24/2016 0841  AST 116*  ALT 25  ALKPHOS 89  BILITOT 3.6*  PROT 5.9*  ALBUMIN 1.1*    Recent Labs Lab 07/12/2016 1258  LIPASE 12  AMYLASE 31    Recent Labs Lab 07/13/2016 1259  AMMONIA 166*   CBC:  Recent Labs Lab 07/06/2016 0841 07/12/2016 1259  WBC 12.2* 10.0  NEUTROABS 10.7*  --   HGB 9.5* 8.2*  HCT 29.3* 26.2*  MCV 108.3* 111.1*  PLT 141* 137*   Cardiac Enzymes:  Recent Labs Lab 07/05/2016 1259 07/15/2016 1841  CKMB 7.0*  --   TROPONINI 0.09* 0.09*   Sepsis Labs:  Recent Labs Lab 07/18/2016 0841 06/28/2016 1037 07/22/2016 1259 07/15/2016 1646  PROCALCITON  --   --  1.05  --   WBC 12.2*  --  10.0  --   LATICACIDVEN 15.3* 15.8* 17.0* 16.9*    Procedures/Operations  4/6 ET TUBE 4/6 Right IJ   Aivah Putman S Melana Hingle 07-03-16, 12:57 AM

## 2016-07-26 NOTE — Progress Notes (Signed)
Initial Nutrition Assessment  DOCUMENTATION CODES:   Not applicable  INTERVENTION:  -Discussed nutrition poc with Annabelle Harman NP with CCM and received verbal order to begin TF. Recommend starting Vital 1.2 at rate of 20 ml/hr with goal rate of 55 ml/hr with Prostat daily providing 114 g of protein, 1684 kcals, 1069 mL of free water. Monitoring magnesium and phosphorus as part of the tube feeding protocol as pt is at risk for refeeding syndrome. Continue to assess   NUTRITION DIAGNOSIS:   Inadequate oral intake related to acute illness as evidenced by NPO status.  GOAL:   Provide needs based on ASPEN/SCCM guidelines  MONITOR:   Vent status, TF tolerance, Labs, Weight trends  REASON FOR ASSESSMENT:   Ventilator    ASSESSMENT:   70 yo male admitted with acute respiratory failure due to acute on chronic COPD exacerbation as well as bilateral pneumonia  Pt with hx of COPD, HTN, seizures, EtOH abuse.    Noted family reports pt is a heavy daily drinker and with decreased appetite and been to weak to drink over the past 2-3 days. Pt has lost estimated 50 pounds over the past year and is extremely weak  Unable to complete Nutrition-Focused physical exam at this time.   Patient is currently intubated on ventilator support MV: 13 L/min Temp (24hrs), Avg:95.7 F (35.4 C), Min:95 F (35 C), Max:97.1 F (36.2 C)  Labs: potassium 2.8, albumin 1.1, corrected calcium 10.62 Meds: NS at 125 ml/hr  Diet Order:  Diet NPO time specified Except for: Sips with Meds  Skin:  Reviewed, no issues  Last BM:  23-Jul-2016  Height:   Ht Readings from Last 1 Encounters:  01/09/16  (1.854 m)    Weight:   Wt Readings from Last 1 Encounters:  23-Jul-2016 150 lb (68 kg)   BMI:  Body mass index is 19.79 kg/m.  Estimated Nutritional Needs:   Kcal:  1660 kcals  Protein:  102-136 g  Fluid:  >/= 1.7 L  EDUCATION NEEDS:   No education needs identified at this time  Romelle Starcher MS, RD,  LDN [phone] Pager  [phone] Weekend/On-Call Pager

## 2016-07-26 NOTE — Progress Notes (Signed)
Pt extubated to room air, per NP order. Family and RN at bedside.

## 2016-07-26 NOTE — ED Provider Notes (Addendum)
[facility] Emergency Department Provider Note   ____________________________________________    I have reviewed the triage vital signs and the nursing notes.   HISTORY  Chief Complaint Altered Mental Status  History limited by altered mental status.   HPI Larry Castaneda is a 71 y.o. male who presents with altered mental status. Per EMS patient last seen normal at 9 PM last night. Found unresponsive this morning by roommate. History as noted below. No report of seizure activity. Review of records demonstrates that the patient does drink daily   Past Medical History:  Diagnosis Date  . COPD (chronic obstructive pulmonary disease) (HCC)   . Hypertension   . Seizures [facility])     Patient Active Problem List   Diagnosis Date Noted  . Dizziness 12/24/2015  . Leg edema 12/24/2015  . Hypokalemia 12/24/2015  . Seizures (HCC) 10/30/2014    Past Surgical History:  Procedure Laterality Date  . none      Prior to Admission medications   Medication Sig Start Date End Date Taking? Authorizing Provider  metoprolol tartrate (LOPRESSOR) 25 MG tablet Take 0.5 tablets (12.5 mg total) by mouth 2 (two) times daily. 12/25/15  Yes Houston Siren, MD  phenytoin (DILANTIN) 100 MG ER capsule Take 200 mg by mouth at bedtime.    Yes Historical Provider, MD  potassium chloride 20 MEQ TBCR Take 10 mEq by mouth 2 (two) times daily. Patient taking differently: Take 10 mEq by mouth daily.  12/25/15 2016/07/10 Yes Houston Siren, MD     Allergies Penicillins  Family History  Problem Relation Age of Onset  . Alzheimer's disease Father     Social History Social History  Substance Use Topics  . Smoking status: Current Every Day Smoker    Packs/day: 1.00    Types: Cigarettes  . Smokeless tobacco: Never Used  . Alcohol use 1.2 oz/week    2 Cans of beer per week     Comment: was drinking Gin up until 3 days ago drank 1 beer 8/3    L5 caveat: Unable to obtain  Review of Systems due to altered mental status    ____________________________________________   PHYSICAL EXAM:  VITAL SIGNS: ED Triage Vitals  Enc Vitals Group     BP 07/10/16 0841 98/68     Pulse Rate 2016/07/10 0841 (!) 110     Resp 2016/07/10 0841 (!) 40     Temp --      Temp src --      SpO2 10-Jul-2016 0841 100 %     Weight 07/10/16 0844 150 lb (68 kg)     Height --      Head Circumference --      Peak Flow --      Pain Score --      Pain Loc --      Pain Edu? --      Excl. in GC? --     Constitutional: Unresponsive, ill-appearing. Does withdraw from pain. No evidence of seizure activity Eyes: Conjunctivae are normal. PERRLA Head: Atraumatic.  Mouth/Throat: Mucous membranes are moist.    Cardiovascular: Tachycardia, regular rhythm. Grossly normal heart sounds.  Good peripheral circulation. Respiratory: Increased respiratory effort with tachypnea and rales bilaterally Gastrointestinal: Soft. No distention.   Genitourinary: No rash or swelling Musculoskeletal:  Warm and well perfused Neurologic:  Evidence of seizure activity Skin:  Skin is warm, dry and intact. No rash noted. Psychiatric: Unable to examine  ____________________________________________   LABS (  all labs ordered are listed, but only abnormal results are displayed)  Labs Reviewed  COMPREHENSIVE METABOLIC PANEL - Abnormal; Notable for the following:       Result Value   Potassium 2.7 (*)    CO2 14 (*)    Glucose, Bld 114 (*)    Creatinine, Ser 2.40 (*)    Calcium 8.3 (*)    Total Protein 5.9 (*)    Albumin 1.1 (*)    AST 116 (*)    Total Bilirubin 3.6 (*)    GFR calc non Af Amer 26 (*)    GFR calc Af Amer 30 (*)    Anion gap 24 (*)    All other components within normal limits  CBC WITH DIFFERENTIAL/PLATELET - Abnormal; Notable for the following:    WBC 12.2 (*)    RBC 2.71 (*)    Hemoglobin 9.5 (*)    HCT 29.3 (*)    MCV 108.3 (*)    MCH 34.9 (*)    RDW 18.9 (*)    Platelets 141 (*)     nRBC 6 (*)    Neutro Abs 10.7 (*)    Lymphs Abs 0.4 (*)    All other components within normal limits  APTT - Abnormal; Notable for the following:    aPTT 50 (*)    All other components within normal limits  LACTIC ACID, PLASMA - Abnormal; Notable for the following:    Lactic Acid, Venous 15.3 (*)    All other components within normal limits  BLOOD GAS, ARTERIAL - Abnormal; Notable for the following:    pH, Arterial 7.28 (*)    pCO2 arterial 22 (*)    pO2, Arterial 61 (*)    Bicarbonate 10.3 (*)    Acid-base deficit 14.8 (*)    All other components within normal limits  PROTIME-INR - Abnormal; Notable for the following:    Prothrombin Time 62.6 (*)    INR 7.00 (*)    All other components within normal limits  URINE CULTURE  CULTURE, BLOOD (ROUTINE X 2)  CULTURE, BLOOD (ROUTINE X 2)  ETHANOL  URINALYSIS, COMPLETE (UACMP) WITH MICROSCOPIC  URINE DRUG SCREEN, QUALITATIVE ([facility] ONLY)  PATHOLOGIST SMEAR REVIEW  LACTIC ACID, PLASMA   ____________________________________________  EKG  ED ECG REPORT I, Jene Every, the attending physician, personally viewed and interpreted this ECG.  Date: 07/22/16 EKG Time: 8:42 AM Rate: 114 Rhythm: Sinus tachycardia QRS Axis: normal Intervals: normal ST/T Wave abnormalities: Nonspecific changes Conduction Disturbances: none   ____________________________________________  RADIOLOGY  X-ray concerning for multilobar pneumonia ____________________________________________   PROCEDURES  Procedure(s) performed: yes  1. INTUBATION Performed by: Jene Every  Required items: required blood products, implants, devices, and special equipment available Patient identity confirmed: provided demographic data and hospital-assigned identification number Time out: Immediately prior to procedure a "time out" was called to verify the correct patient, procedure, equipment, support staff and site/side marked as required.  Indications:  hypoxemia  Intubation method: Glidescope Laryngoscopy   Preoxygenation: non-rebreather and BVM  Sedatives: 20Etomidate Paralytic: 60 Succinylcholine  Tube Size:8 cuffed  Post-procedure assessment: chest rise and ETCO2 monitor Breath sounds: equal and absent over the epigastrium Tube secured with: ETT holder Chest x-ray interpreted by radiologist and me.  Chest x-ray findings:endotracheal tube in appropriate position  Patient tolerated the procedure well with no immediate complications.  2. Central Line  CENTRAL LINE Performed by: Jene Every Consent: The procedure was performed in an emergent situation. Required items: required blood products, implants, devices,  and special equipment available Patient identity confirmed: arm band and provided demographic data Time out: Immediately prior to procedure a "time out" was called to verify the correct patient, procedure, equipment, support staff and site/side marked as required. Indications: vascular access Anesthesia: local infiltration Local anesthetic: lidocaine 1% with epinephrine Anesthetic total: 3 ml Patient sedated: no Preparation: skin prepped with 2% chlorhexidine Skin prep agent dried: skin prep agent completely dried prior to procedure Sterile barriers: all five maximum sterile barriers used - cap, mask, sterile gown, sterile gloves, and large sterile sheet Hand hygiene: hand hygiene performed prior to central venous catheter insertion  Location details: Right IJ  Catheter type: triple lumen Catheter size: 8 Fr Pre-procedure: landmarks identified Ultrasound guidance: yes Successful placement: yes Post-procedure: line sutured and dressing applied Assessment: blood return through all parts, free fluid flow, placement verified by x-ray and no pneumothorax on x-ray Patient tolerance: Patient tolerated the procedure well with no immediate complications.     Critical Care performed: yes  CRITICAL  CARE Performed by: Jene Every   Total critical care time: 50 minutes  Critical care time was exclusive of separately billable procedures and treating other patients.  Critical care was necessary to treat or prevent imminent or life-threatening deterioration.  Critical care was time spent personally by me on the following activities: development of treatment plan with patient and/or surrogate as well as nursing, discussions with consultants, evaluation of patient's response to treatment, examination of patient, obtaining history from patient or surrogate, ordering and performing treatments and interventions, ordering and review of laboratory studies, ordering and review of radiographic studies, pulse oximetry and re-evaluation of patient's condition. ____________________________________________   INITIAL IMPRESSION / ASSESSMENT AND PLAN / ED COURSE  Pertinent labs & imaging results that were available during my care of the patient were reviewed by me and considered in my medical decision making (see chart for details).  Patient presents unresponsive. Differential includes postictal, DTs, ICH, sepsis. CT head, chest x-ray, labs, fluids in process  ----------------------------------------- 8:56 AM on Jul 15, 2016 -----------------------------------------  Chest x-ray concerning for pneumonia, broad-spectrum IV antibiotics started  Patient's ABG demonstrates PO2 of 61. Decision made to intubate. Especially given severe pneumonia and lactic acid of 15.  Clinical Course as of Jul 01 1056  Fri 07/15/2016  0939 pO2, Arterial: (!) 61 [RK]  0944 Bicarbonate: (!) 10.3 [RK]    Clinical Course User Index [RK] Jene Every, MD    ----------------------------------------- 10:51 AM on 07/15/16 -----------------------------------------  Patient continues to be hypotensive even after fluids. Levothroid started peripherally, will place central line for persistent hypotension/septic  shock. D/W hospitalist and ICU physician. ____________________________________________    ED Sepsis - Repeat Assessment   Performed at:    10:40  Last Vitals:    Blood pressure (!) 77/52, pulse (!) 83, temperature 97.1 F (36.2 C), resp. rate (!) 37, weight 68 kg, SpO2 99 %.  Heart:      HR 83, rrr, occ PVC  Lungs:     Rales diffusely  Capillary Refill:   normal  Peripheral Pulse (include location): 2+left radial   Skin (include color):   Warm and pink      FINAL CLINICAL IMPRESSION(S) / ED DIAGNOSES  Final diagnoses:  Septic shock (HCC)      NEW MEDICATIONS STARTED DURING THIS VISIT:  New Prescriptions   No medications on file     Note:  This document was prepared using Dragon voice recognition software and may include unintentional dictation errors.  Jene Every, MD 07-17-16 1059    Jene Every, MD July 17, 2016 [phone]

## 2016-07-26 NOTE — Progress Notes (Signed)
Pt continues to decline despite aggressive treatment.  ABG results revealed pt continues to have severe metabolic acidosis despite administration of 3 amps of sodium bicarb and initiation of sodium bicarb gtt at 100 ml/hr.  We are unable to safely place a dialysis catheter due to INR results of 9.46 due to significant risk of bleeding.  Also, pt is hypoxic with a PEEP of 10 and FiO2 at 100% with agonal respirations.  Therefore, notified pts family to come back to the hospital.  Upon pts families arrival I met with the pts daughter, son, and other family members to discuss poor prognosis and code status.  The family decided they would like the pts code status to be changed to DNR.  They stated they understand in the event the pt goes into a life threatening cardiac rhythm we would not perform lifesaving measures.  We will continue current treatment the family is waiting on additional family members to arrive.  Orders placed to administer 4 additional amps of sodium bicarb and sodium bicarb drip increased to 150 ml/hr will repeat ABG at 19:30.  I spoke with Dr. Ashby Dawes and Dr. Emmit Alexanders regarding plan of care they both are agreeable with current plan.  Marda Stalker, [place] Pager [phone] (please enter 7 digits) PCCM Consult Pager [phone] (please enter 7 digits)

## 2016-07-26 NOTE — ED Notes (Signed)
Intubation. MD at bedside

## 2016-07-26 NOTE — Progress Notes (Signed)
*  PRELIMINARY RESULTS* Echocardiogram 2D Echocardiogram has been performed.  Larry Castaneda 07/21/2016, 1:47 PM

## 2016-07-26 NOTE — Progress Notes (Signed)
Pharmacy Antibiotic Note  Larry Castaneda is a 71 y.o. male admitted on 07/23/2016 with bilateral pneumonia and respiratory acidosis.  Pharmacy has been consulted for meropenem dosing.  Plan: Patient initially received levofloxacin  IV and vancomycin 1g IV in ED. MRSA PCR was negative.  Will start patient on meropenem 1g IV Q12h and evaluate renal function with am labs.   Weight: 150 lb (68 kg)  Temp (24hrs), Avg:96.5 F (35.8 C), Min:94.1 F (34.5 C), Max:98.6 F (37 C)   Recent Labs Lab 07/08/2016 0841 07/15/2016 1037 06/26/2016 1259 07/09/2016 1646 06/30/2016 2109  WBC 12.2*  --  10.0  --   --   CREATININE 2.40*  --  2.25*  --  2.41*  LATICACIDVEN 15.3* 15.8* 17.0* 16.9*  --     Estimated Creatinine Clearance: 27 mL/min (A) (by C-G formula based on SCr of 2.41 mg/dL (H)).    Allergies  Allergen Reactions  . Penicillins Swelling    Antimicrobials this admission: Levofloxacin 4/6 >> 4/6 Vancomycin 4/6 >> 4/6  Dose adjustments this admission: N/A  Microbiology results: 4/6 BCx: pending  4/6 Sputum: pending  4/6 MRSA PCR: negative  Thank you for allowing pharmacy to be a part of this patient's care.  Tamekia Rotter L 07/23/2016 10:28 PM

## 2016-07-26 NOTE — ED Triage Notes (Signed)
Patient arrival from home via [facility] c/o unresponsive. Patient was last seen normal at 2100 last night by his room mate. Found at 0800 this morning unresponsive. EMS noted CBG 51 on scene, D50 administered, subsequent CBG:  120. Patient noted to have BP 72/50 on scene, given, 118/70 after.   Patient arrives with labored tachypnea, upward gaze. MD at bedside

## 2016-07-26 NOTE — ED Notes (Signed)
NO UOP at this time, unable to obtain sample

## 2016-07-26 DEATH — deceased

## 2018-02-26 IMAGING — CT CT HEAD W/O CM
3 of 4 series · 16 of 47 positions shown, 19 images · non-contrast
Comparison: CT head 10/30/2014

CLINICAL DATA: Altered mental status

EXAM:
CT HEAD WITHOUT CONTRAST
TECHNIQUE: Contiguous axial images were obtained from the base of the skull
through the vertex without intravenous contrast.

[Series 4: head wo · axial · 0.43mm/px · z∈[-120,+15]mm · 10 of 31 slices shown, 13 images]
[im 2/31  brain]
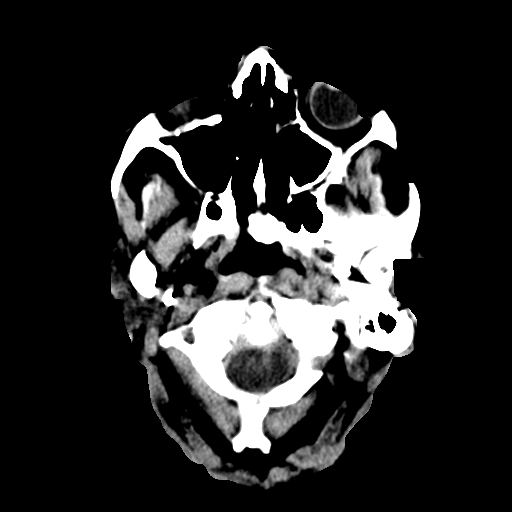
[im 2/31  bone]
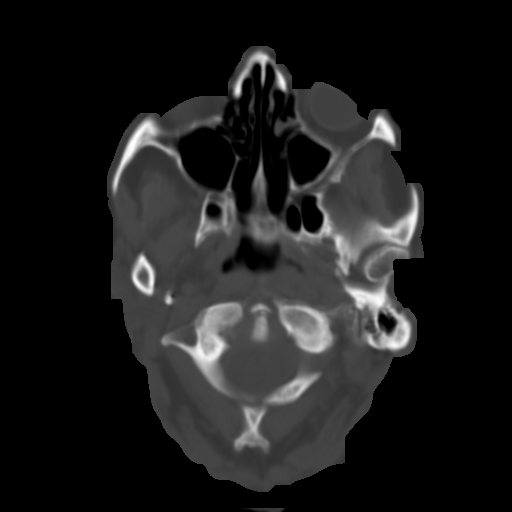
[im 6/31  brain]
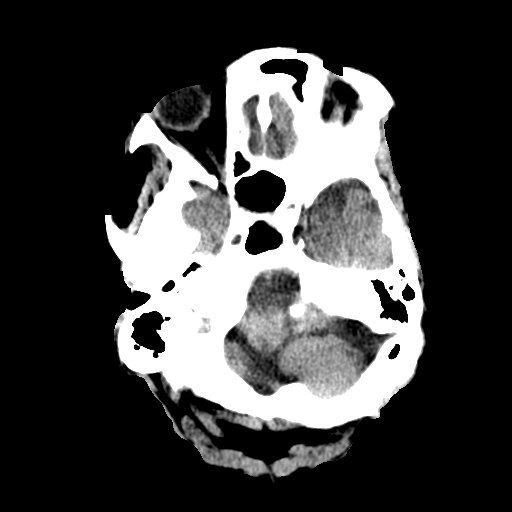
[im 9/31  brain]
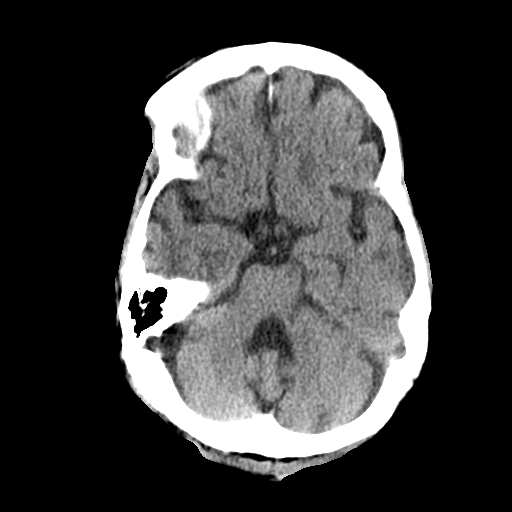
[im 11/31  brain]
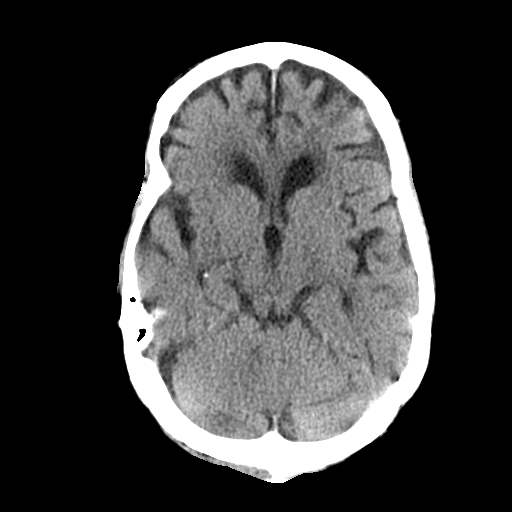
[im 14/31  brain]
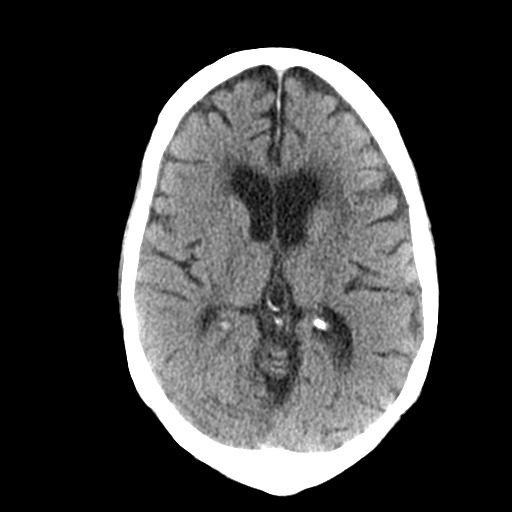
[im 14/31  bone]
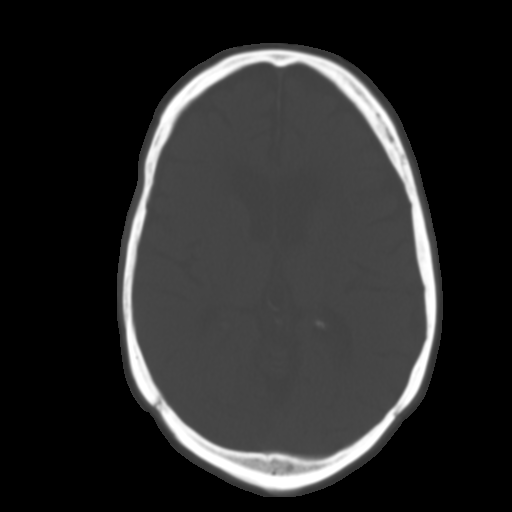
[im 17/31  brain]
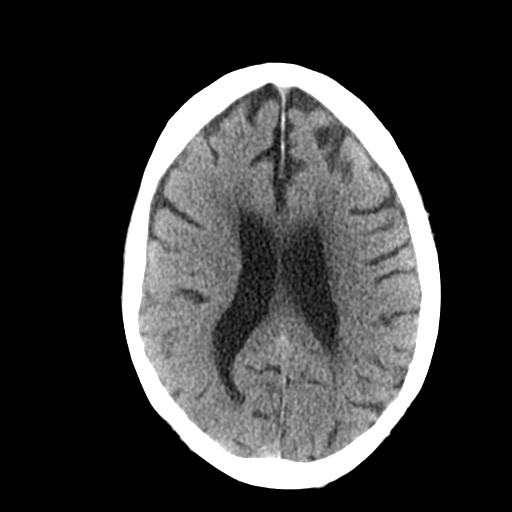
[im 21/31  brain]
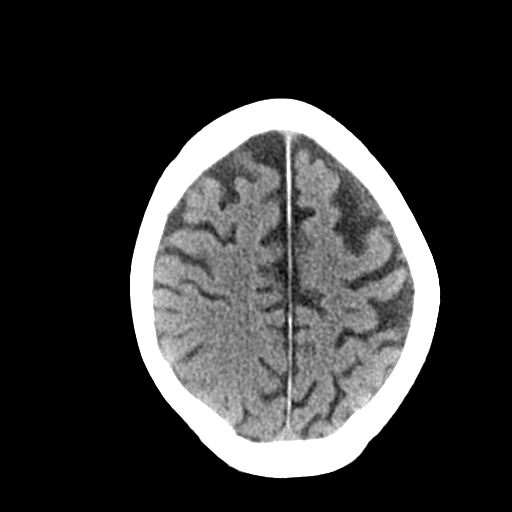
[im 22/31  brain]
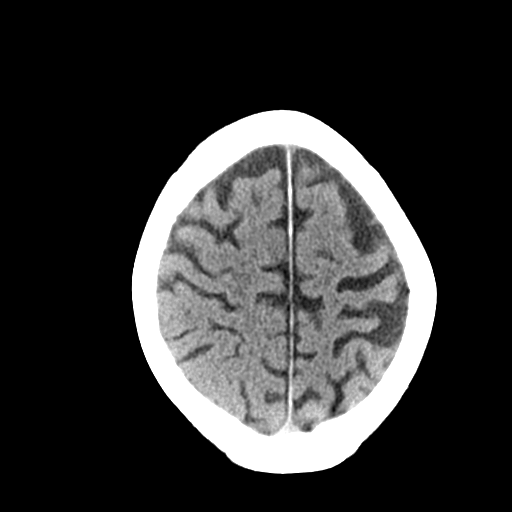
[im 26/31  brain]
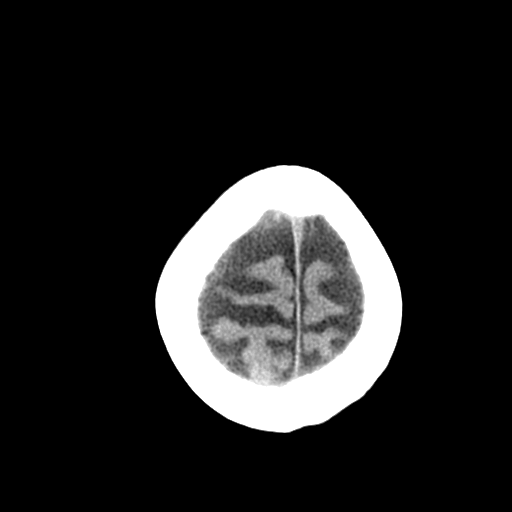
[im 26/31  bone]
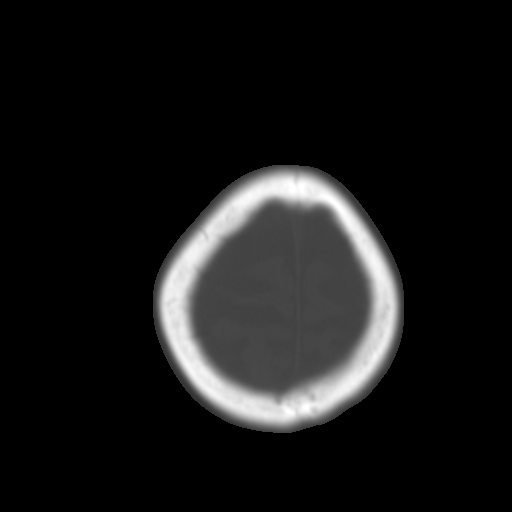
[im 29/31  brain]
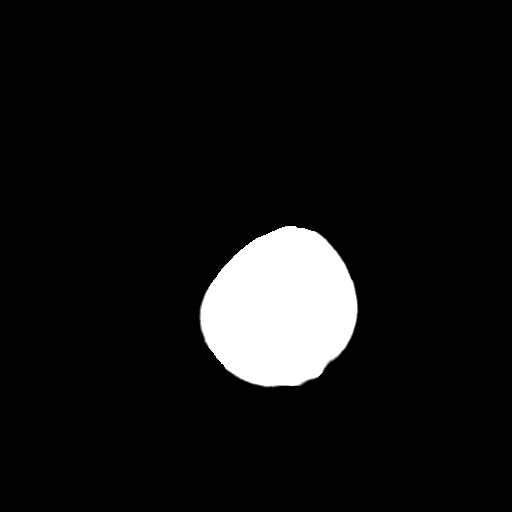

[Series 6: coronal soft tissue · coronal · 0.36mm/px · 3 of 67 slices shown]
[im 23/67  brain]
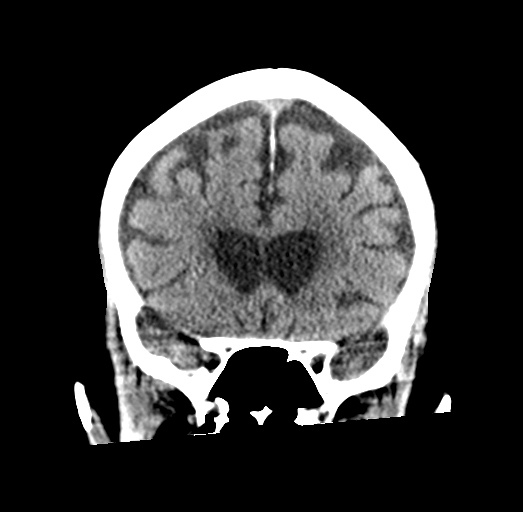
[im 30/67  brain]
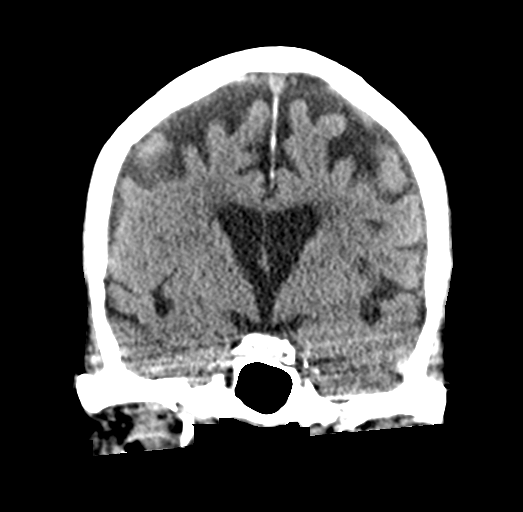
[im 37/67  brain]
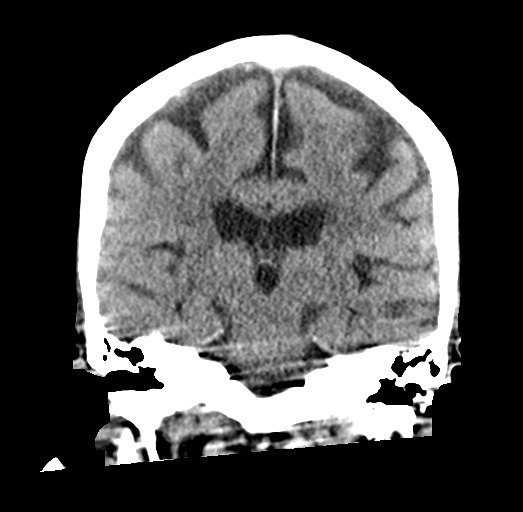

[Series 7: sagittal soft tissue · sagittal · 0.36mm/px · 3 of 50 slices shown]
[im 17/50  brain]
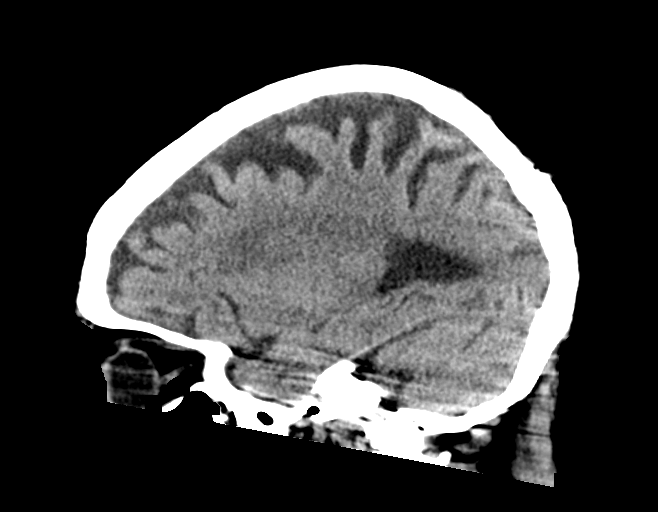
[im 25/50  brain]
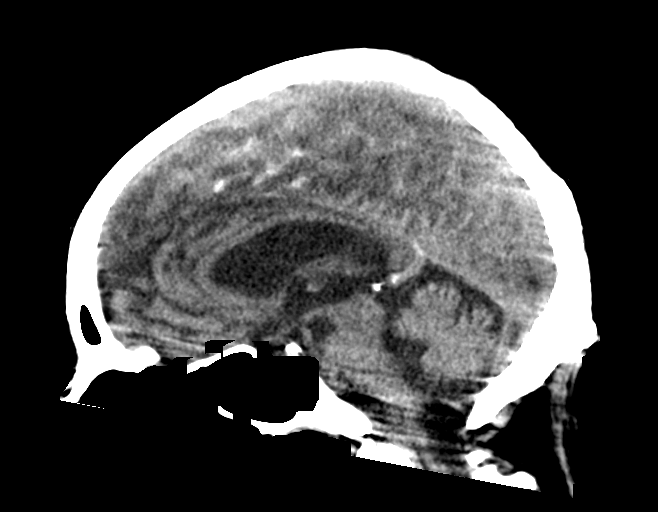
[im 33/50  brain]
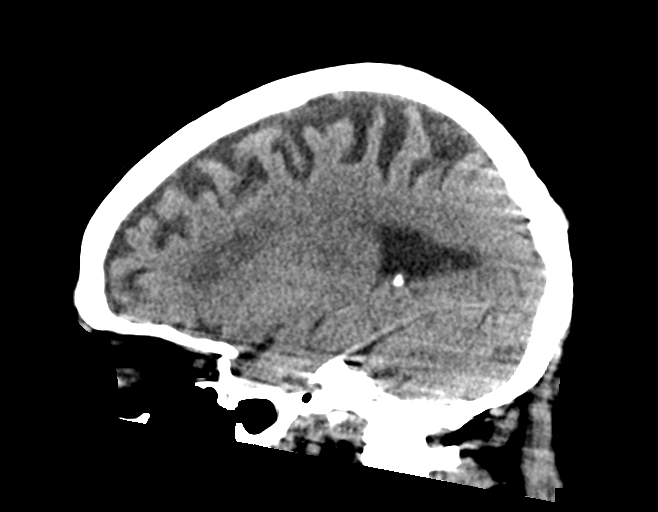

[16 of 47 positions shown; findings below may reference images not displayed]

FINDINGS: Brain: Mild to moderate atrophy. He is white matter hypodensity has
progressed in the interval. Atrophy has progressed since the prior
study.

Negative for acute infarct. Negative for hemorrhage or mass. No
shift of the midline structures.

Vascular: Negative for hyperdense vessel.  Atherosclerotic disease.

Skull: Negative

Sinuses/Orbits: Negative

Other: None
IMPRESSION: No acute abnormality

Progression of atrophy and chronic microvascular ischemia in the
white matter since 10/30/2014

## 2018-02-26 IMAGING — DX DG CHEST 1V
1 series · 1 of 1 positions shown · non-contrast
Comparison: Earlier today.

CLINICAL DATA: Intubated.

EXAM:
CHEST 1 VIEW

[chest ap]
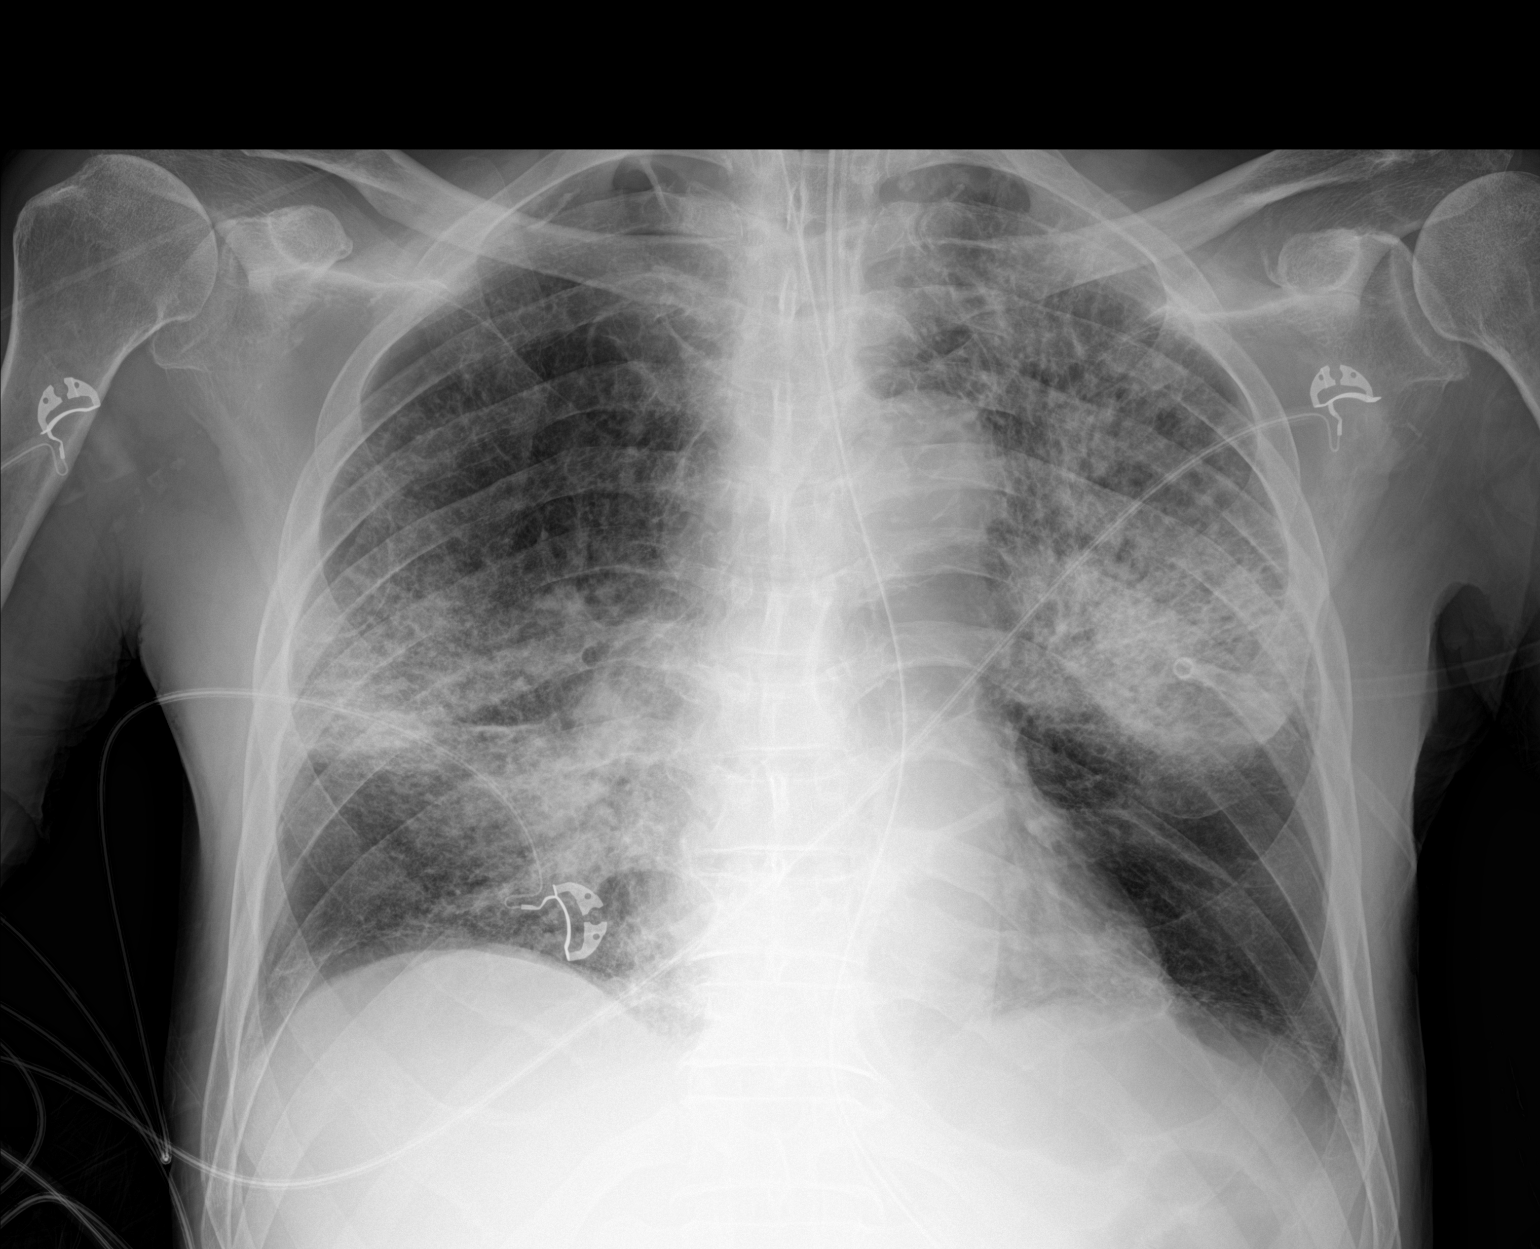

[1 of 1 positions shown; findings below may reference images not displayed]

FINDINGS: Interval endotracheal tube in satisfactory position. Interval
nasogastric tube extending into the stomach. Normal sized heart. No
significant change in bilateral patchy opacities. Mild aortic
calcification. No acute bony abnormality.
IMPRESSION: 1. Endotracheal tube in satisfactory position.
2. Stable bilateral pneumonia.
3. Mild aortic atherosclerosis.
